# Patient Record
Sex: Female | Born: 1990 | Race: Black or African American | Hispanic: No | Marital: Single | Smoking: Never smoker
Health system: Southern US, Community
[De-identification: ages and names within clinical notes are randomized; demographics above are authoritative.]

## PROBLEM LIST (undated history)

## (undated) ENCOUNTER — Inpatient Hospital Stay (HOSPITAL_COMMUNITY): Payer: Self-pay

## (undated) DIAGNOSIS — J45909 Unspecified asthma, uncomplicated: Secondary | ICD-10-CM

## (undated) HISTORY — PX: NO PAST SURGERIES: SHX2092

---
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
## 2011-02-10 ENCOUNTER — Encounter: Payer: Self-pay | Admitting: Medical

## 2011-02-10 ENCOUNTER — Emergency Department (HOSPITAL_COMMUNITY)
Admission: EM | Admit: 2011-02-10 | Discharge: 2011-02-10 | Disposition: A | Discharge status: Home / Self Care | Payer: No Typology Code available for payment source | Attending: Emergency Medicine | Admitting: Emergency Medicine

## 2011-02-10 DIAGNOSIS — R51 Headache: Secondary | ICD-10-CM | POA: Insufficient documentation

## 2011-02-10 DIAGNOSIS — M542 Cervicalgia: Secondary | ICD-10-CM | POA: Insufficient documentation

## 2012-09-30 ENCOUNTER — Emergency Department (HOSPITAL_COMMUNITY)
Admission: EM | Admit: 2012-09-30 | Discharge: 2012-09-30 | Disposition: A | Discharge status: Home / Self Care | Payer: Self-pay | Attending: Emergency Medicine | Admitting: Emergency Medicine

## 2012-09-30 ENCOUNTER — Encounter (HOSPITAL_COMMUNITY): Payer: Self-pay | Admitting: Family Medicine

## 2012-09-30 DIAGNOSIS — N949 Unspecified condition associated with female genital organs and menstrual cycle: Secondary | ICD-10-CM | POA: Insufficient documentation

## 2012-09-30 DIAGNOSIS — R350 Frequency of micturition: Secondary | ICD-10-CM | POA: Insufficient documentation

## 2012-09-30 DIAGNOSIS — B9689 Other specified bacterial agents as the cause of diseases classified elsewhere: Secondary | ICD-10-CM

## 2012-09-30 DIAGNOSIS — R300 Dysuria: Secondary | ICD-10-CM | POA: Insufficient documentation

## 2012-09-30 DIAGNOSIS — N390 Urinary tract infection, site not specified: Secondary | ICD-10-CM

## 2012-09-30 DIAGNOSIS — Z79899 Other long term (current) drug therapy: Secondary | ICD-10-CM | POA: Insufficient documentation

## 2012-09-30 DIAGNOSIS — M129 Arthropathy, unspecified: Secondary | ICD-10-CM | POA: Insufficient documentation

## 2012-09-30 DIAGNOSIS — N760 Acute vaginitis: Secondary | ICD-10-CM | POA: Insufficient documentation

## 2012-09-30 DIAGNOSIS — F17200 Nicotine dependence, unspecified, uncomplicated: Secondary | ICD-10-CM | POA: Insufficient documentation

## 2012-09-30 DIAGNOSIS — Z3202 Encounter for pregnancy test, result negative: Secondary | ICD-10-CM | POA: Insufficient documentation

## 2012-09-30 LAB — URINALYSIS, ROUTINE W REFLEX MICROSCOPIC
Protein, ur: 30 mg/dL — AB
Specific Gravity, Urine: 1.027 (ref 1.005–1.030)
Urobilinogen, UA: 0.2 mg/dL (ref 0.0–1.0)

## 2012-09-30 LAB — URINE MICROSCOPIC-ADD ON

## 2012-09-30 LAB — POCT PREGNANCY, URINE: Preg Test, Ur: NEGATIVE

## 2012-09-30 LAB — WET PREP, GENITAL: Yeast Wet Prep HPF POC: NONE SEEN

## 2012-09-30 MED ORDER — CEPHALEXIN 500 MG PO CAPS: 500.0000 mg | ORAL_CAPSULE | Freq: Four times a day (QID) | ORAL | Status: DC

## 2012-09-30 MED ORDER — METRONIDAZOLE 500 MG PO TABS: 500.0000 mg | ORAL_TABLET | Freq: Two times a day (BID) | ORAL | Status: DC

## 2012-09-30 NOTE — ED Provider Notes (Signed)
History     CSN: [id number]  Arrival date & time 09/30/12  0041   First MD Initiated Contact with Patient 09/30/12 0116      Chief Complaint  Patient presents with  . Abdominal Pain    (Consider location/radiation/quality/duration/timing/severity/associated sxs/prior treatment) HPI Comments: Patient is a three-day history of vaginal pain that is worse with urination. Endorses urinary frequency, dysuria and burning with urination. Denies any abdominal pain, nausea, vomiting, diarrhea or fever. No vaginal bleeding or discharge. Last menstrual period February 26. Denies any back pain, chest pain or shortness of breath. No rashes.  The history is provided by the patient.    Past Medical History  Diagnosis Date  . Arthritis     History reviewed. No pertinent past surgical history.  No family history on file.  History  Substance Use Topics  . Smoking status: Current Every Day Smoker -- 0.25 packs/day    Types: Cigarettes  . Smokeless tobacco: Not on file  . Alcohol Use: Yes     Comment: Rarely    OB History   Grav Para Term Preterm Abortions TAB SAB Ect Mult Living                  Review of Systems  Constitutional: Negative for fever, activity change and appetite change.  HENT: Negative for congestion and rhinorrhea.   Respiratory: Negative for cough, chest tightness and shortness of breath.   Cardiovascular: Negative for chest pain.  Gastrointestinal: Positive for abdominal pain. Negative for nausea and vomiting.  Genitourinary: Positive for dysuria, frequency and vaginal pain. Negative for hematuria, vaginal bleeding and vaginal discharge.  Musculoskeletal: Negative for back pain.  Skin: Negative for rash.  Neurological: Negative for dizziness, weakness and headaches.  A complete 10 system review of systems was obtained and all systems are negative except as noted in the HPI and PMH.    Allergies  Review of patient's allergies indicates no known  allergies.  Home Medications   Current Outpatient Rx  Name  Route  Sig  Dispense  Refill  . CRANBERRY EXTRACT PO   Oral   Take 1 tablet by mouth daily.          Marland Kitchen ibuprofen (ADVIL,MOTRIN) 200 MG tablet   Oral   Take 200 mg by mouth every 6 (six) hours as needed for pain. For pain         . Phenazopyridine HCl (AZO TABS PO)   Oral   Take 1 tablet by mouth 3 (three) times daily.         . cephALEXin (KEFLEX) 500 MG capsule   Oral   Take 1 capsule (500 mg total) by mouth 4 (four) times daily.   40 capsule   0   . metroNIDAZOLE (FLAGYL) 500 MG tablet   Oral   Take 1 tablet (500 mg total) by mouth 2 (two) times daily.   14 tablet   0     BP 112/67  Pulse 87  Temp(Src) 98.3 F (36.8 C) (Oral)  Resp 16  SpO2 98%  LMP 09/01/2012  Physical Exam  Constitutional: She is oriented to person, place, and time. She appears well-developed and well-nourished. No distress.  HENT:  Head: Normocephalic and atraumatic.  Mouth/Throat: Oropharynx is clear and moist. No oropharyngeal exudate.  Eyes: Conjunctivae and EOM are normal.  Neck: Normal range of motion. Neck supple.  Cardiovascular: Normal rate, regular rhythm and normal heart sounds.   No murmur heard. Pulmonary/Chest: Effort normal and breath sounds  normal. No respiratory distress.  Abdominal: Soft. There is no tenderness. There is no rebound and no guarding.  Genitourinary: There is no tenderness on the right labia. There is no tenderness on the left labia. Cervix exhibits no motion tenderness. Right adnexum displays no mass and no tenderness. Left adnexum displays no mass and no tenderness. No vaginal discharge found.  Chaperone present  Musculoskeletal: Normal range of motion. She exhibits no edema and no tenderness.  Neurological: She is alert and oriented to person, place, and time.  Skin: Skin is warm.    ED Course  Procedures (including critical care time)  Labs Reviewed  WET PREP, GENITAL - Abnormal;  Notable for the following:    Clue Cells Wet Prep HPF POC RARE (*)    WBC, Wet Prep HPF POC FEW (*)    All other components within normal limits  URINALYSIS, ROUTINE W REFLEX MICROSCOPIC - Abnormal; Notable for the following:    APPearance CLOUDY (*)    Hgb urine dipstick SMALL (*)    Protein, ur 30 (*)    Nitrite POSITIVE (*)    Leukocytes, UA LARGE (*)    All other components within normal limits  URINE MICROSCOPIC-ADD ON - Abnormal; Notable for the following:    Bacteria, UA FEW (*)    All other components within normal limits  GC/CHLAMYDIA PROBE AMP  URINE CULTURE  POCT PREGNANCY, URINE   No results found.   1. Urinary tract infection   2. Bacterial vaginosis       MDM  Vaginal pain, dysuria for the past week. Abdomen soft and nontender. No associated symptoms.  Urinalysis positive for infection. Pregnancy test negative. Pelvic exam benign. We'll treat bacterial vaginosis and UTI.  Tolerating by mouth in ED. Abdomen soft and nontender. Return precautions discussed.     Glynn Octave, MD 09/30/12 [phone]

## 2012-09-30 NOTE — ED Notes (Signed)
Patient states that she has had abdominal pain for a week. Denies n/v/d or fever. Indicates suprapubic area; reports having dysuria.

## 2012-10-01 LAB — URINE CULTURE

## 2012-10-02 ENCOUNTER — Telehealth (HOSPITAL_COMMUNITY): Payer: Self-pay | Admitting: Emergency Medicine

## 2012-10-02 NOTE — ED Notes (Signed)
+  Urine. Patient treated with Keflex. Sensitive to same. Per protocol MD. °

## 2012-10-02 NOTE — ED Notes (Signed)
Patient has +Urine culture. Checking to see if appropriately treated. °

## 2013-03-11 ENCOUNTER — Emergency Department (HOSPITAL_COMMUNITY)
Admission: EM | Admit: 2013-03-11 | Discharge: 2013-03-12 | Disposition: A | Discharge status: Home / Self Care | Payer: Medicaid Other | Attending: Emergency Medicine | Admitting: Emergency Medicine

## 2013-03-11 ENCOUNTER — Encounter (HOSPITAL_COMMUNITY): Payer: Self-pay | Admitting: Emergency Medicine

## 2013-03-11 DIAGNOSIS — R350 Frequency of micturition: Secondary | ICD-10-CM | POA: Insufficient documentation

## 2013-03-11 DIAGNOSIS — R109 Unspecified abdominal pain: Secondary | ICD-10-CM | POA: Insufficient documentation

## 2013-03-11 DIAGNOSIS — O9989 Other specified diseases and conditions complicating pregnancy, childbirth and the puerperium: Secondary | ICD-10-CM | POA: Insufficient documentation

## 2013-03-11 DIAGNOSIS — R110 Nausea: Secondary | ICD-10-CM | POA: Insufficient documentation

## 2013-03-11 DIAGNOSIS — N898 Other specified noninflammatory disorders of vagina: Secondary | ICD-10-CM | POA: Insufficient documentation

## 2013-03-11 DIAGNOSIS — O99330 Smoking (tobacco) complicating pregnancy, unspecified trimester: Secondary | ICD-10-CM | POA: Insufficient documentation

## 2013-03-11 DIAGNOSIS — Z3490 Encounter for supervision of normal pregnancy, unspecified, unspecified trimester: Secondary | ICD-10-CM

## 2013-03-11 NOTE — ED Notes (Signed)
Pt c/o mid abd pain onset 1-2 weeks, +nausea, denies v/d, denies fever. Denies urinary s/s, denies gyn s/s

## 2013-03-12 ENCOUNTER — Emergency Department (HOSPITAL_COMMUNITY): Payer: Medicaid Other

## 2013-03-12 LAB — CBC
HCT: 36.6 % (ref 36.0–46.0)
Hemoglobin: 12.6 g/dL (ref 12.0–15.0)
MCH: 29 pg (ref 26.0–34.0)
MCHC: 34.4 g/dL (ref 30.0–36.0)
MCV: 84.1 fL (ref 78.0–100.0)
Platelets: 281 10*3/uL (ref 150–400)
RBC: 4.35 MIL/uL (ref 3.87–5.11)
RDW: 13.7 % (ref 11.5–15.5)
WBC: 8.6 10*3/uL (ref 4.0–10.5)

## 2013-03-12 LAB — ABO/RH: ABO/RH(D): O POS

## 2013-03-12 LAB — COMPREHENSIVE METABOLIC PANEL
ALT: 10 U/L (ref 0–35)
AST: 15 U/L (ref 0–37)
Albumin: 4 g/dL (ref 3.5–5.2)
Alkaline Phosphatase: 51 U/L (ref 39–117)
BUN: 5 mg/dL — ABNORMAL LOW (ref 6–23)
CO2: 23 mEq/L (ref 19–32)
Calcium: 9.4 mg/dL (ref 8.4–10.5)
Chloride: 98 mEq/L (ref 96–112)
Creatinine, Ser: 0.63 mg/dL (ref 0.50–1.10)
GFR calc Af Amer: >90 mL/min (ref >90)
GFR calc non Af Amer: >90 mL/min (ref >90)
Glucose, Bld: 80 mg/dL (ref 70–99)
Potassium: 3.7 mEq/L (ref 3.5–5.1)
Sodium: 132 mEq/L — ABNORMAL LOW (ref 135–145)
Total Bilirubin: 0.3 mg/dL (ref 0.3–1.2)
Total Protein: 8 g/dL (ref 6.0–8.3)

## 2013-03-12 LAB — URINALYSIS, ROUTINE W REFLEX MICROSCOPIC
Glucose, UA: NEGATIVE mg/dL
Hgb urine dipstick: NEGATIVE
Ketones, ur: >80 mg/dL — AB
Nitrite: NEGATIVE
Protein, ur: NEGATIVE mg/dL
Specific Gravity, Urine: 1.034 — ABNORMAL HIGH (ref 1.005–1.030)
Urobilinogen, UA: 2 mg/dL — ABNORMAL HIGH (ref 0.0–1.0)
pH: 6 (ref 5.0–8.0)

## 2013-03-12 LAB — URINE MICROSCOPIC-ADD ON

## 2013-03-12 LAB — TYPE AND SCREEN
ABO/RH(D): O POS
Antibody Screen: NEGATIVE

## 2013-03-12 LAB — HCG, QUANTITATIVE, PREGNANCY: hCG, Beta Chain, Quant, S: 70498 m[IU]/mL — ABNORMAL HIGH (ref <5)

## 2013-03-12 LAB — WET PREP, GENITAL
Clue Cells Wet Prep HPF POC: NONE SEEN
Trich, Wet Prep: NONE SEEN
Yeast Wet Prep HPF POC: NONE SEEN

## 2013-03-12 LAB — PREGNANCY, URINE: Preg Test, Ur: POSITIVE — AB

## 2013-03-12 MED ORDER — ONDANSETRON HCL 8 MG PO TABS: 8.0000 mg | ORAL_TABLET | Freq: Three times a day (TID) | ORAL | Status: DC | PRN

## 2013-03-12 MED ORDER — ONDANSETRON 4 MG PO TBDP
4.0000 mg | ORAL_TABLET | Freq: Once | ORAL | Status: AC
  Administered 2013-03-12: 4 mg via ORAL
  Filled 2013-03-12: qty 1
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
## 2013-03-12 NOTE — ED Provider Notes (Signed)
Medical screening examination/treatment/procedure(s) were performed by non-physician practitioner and as supervising physician I was immediately available for consultation/collaboration.   Moise Friday E Oliwia Berzins, MD 03/12/13 0648 

## 2013-03-12 NOTE — ED Provider Notes (Signed)
CSN: [id number]     Arrival date & time 03/11/13  2317 History   First MD Initiated Contact with Patient 03/11/13 2331     Chief Complaint  Patient presents with  . Abdominal Pain   (Consider location/radiation/quality/duration/timing/severity/associated sxs/prior Treatment) HPI Pt is a 22yo female with c/o constant intermittent abdominal pain that is cramping in nature, 3/10 at best, 7/10 at worst.  Pain is worse in middle right side of abdomen, worse with palpation.  Pt also c/o nausea w/o vomiting.  Pt has 2-3 episodes of diarrhea per day for 1 week w/o blood or mucous.  Pt is sexually active, not on birth control and does not use condoms when having intercourse.  States LMP was at end of July but she does not normally have regular menstrual cycles.  Denies hx of previous pregnancy, not concerned for STDs.  No hx of similar pain.  No hx of abdominal surgeries.    History reviewed. No pertinent past medical history. History reviewed. No pertinent past surgical history. No family history on file. History  Substance Use Topics  . Smoking status: Current Every Day Smoker -- 0.25 packs/day    Types: Cigarettes  . Smokeless tobacco: Not on file  . Alcohol Use: Yes     Comment: Rarely   OB History   Grav Para Term Preterm Abortions TAB SAB Ect Mult Living                 Review of Systems  Constitutional: Negative for fever and chills.  Gastrointestinal: Positive for nausea and abdominal pain. Negative for vomiting and diarrhea.  Genitourinary: Positive for frequency. Negative for dysuria, flank pain, vaginal bleeding, vaginal discharge, vaginal pain and pelvic pain.  All other systems reviewed and are negative.    Allergies  Review of patient's allergies indicates no known allergies.  Home Medications   Current Outpatient Rx  Name  Route  Sig  Dispense  Refill  . bismuth subsalicylate (PEPTO BISMOL) 262 MG chewable tablet   Oral   Chew 524 mg by mouth daily as needed for  indigestion.          BP 111/71  Pulse 101  Temp(Src) 98.9 F (37.2 C) (Oral)  Resp 20  Ht 5\' 1"  (1.549 m)  Wt 131 lb (59.421 kg)  BMI 24.76 kg/m2  SpO2 100%  LMP 02/03/2013 Physical Exam  Nursing note and vitals reviewed. Constitutional: She appears well-developed and well-nourished. No distress.  HENT:  Head: Normocephalic and atraumatic.  Eyes: Conjunctivae are normal. No scleral icterus.  Neck: Normal range of motion.  Cardiovascular: Normal rate, regular rhythm and normal heart sounds.   Pulmonary/Chest: Effort normal and breath sounds normal. No respiratory distress. She has no wheezes. She has no rales. She exhibits no tenderness.  Abdominal: Soft. Bowel sounds are normal. She exhibits no distension and no mass. There is tenderness ( right abdomen). There is no rebound and no guarding.    Genitourinary: Uterus normal. No labial fusion. There is no rash, tenderness, lesion or injury on the right labia. There is no rash, tenderness, lesion or injury on the left labia. Cervix exhibits discharge ( small amount of white discharge ). Cervix exhibits no motion tenderness and no friability. Right adnexum displays no mass, no tenderness and no fullness. Left adnexum displays no mass, no tenderness and no fullness. No erythema, tenderness or bleeding around the vagina. No foreign body around the vagina. No signs of injury around the vagina. Vaginal discharge (small amount of  clear to white discharge in vaginal canal) found.  Musculoskeletal: Normal range of motion.  Neurological: She is alert.  Skin: Skin is warm and dry. She is not diaphoretic.    ED Course  Procedures (including critical care time) Labs Review Labs Reviewed  WET PREP, GENITAL - Abnormal; Notable for the following:    WBC, Wet Prep HPF POC TOO NUMEROUS TO COUNT (*)    All other components within normal limits  URINALYSIS, ROUTINE W REFLEX MICROSCOPIC - Abnormal; Notable for the following:    Specific Gravity,  Urine 1.034 (*)    Bilirubin Urine SMALL (*)    Ketones, ur >80 (*)    Urobilinogen, UA 2.0 (*)    Leukocytes, UA SMALL (*)    All other components within normal limits  PREGNANCY, URINE - Abnormal; Notable for the following:    Preg Test, Ur POSITIVE (*)    All other components within normal limits  COMPREHENSIVE METABOLIC PANEL - Abnormal; Notable for the following:    Sodium 132 (*)    BUN 5 (*)    All other components within normal limits  HCG, QUANTITATIVE, PREGNANCY - Abnormal; Notable for the following:    hCG, Beta Chain, Quant, [place] [address] (*)    All other components within normal limits  URINE MICROSCOPIC-ADD ON - Abnormal; Notable for the following:    Squamous Epithelial / LPF FEW (*)    All other components within normal limits  GC/CHLAMYDIA PROBE AMP  CBC  TYPE AND SCREEN  ABO/RH   Imaging Review No results found.  MDM  No diagnosis found. Abdominal pain initially non-specific, mild TTP in right abdomen.  Ordered CBC and CMP with UA and urine pregnancy.  Pt found to be pregnant on urine pregnancy.  Wet prep and GC/Chlamydia sent.  Will get quantitative Hcg, type and screen as well as pelvic ultrasound.  Informed pt of positive pregnancy test.  She admitted to not using protection while having intercourse with her boyfriend but states she is not concerned for an STD.  Tx: zofran, did not need pain medications at this time.  Stated Zofran did help significantly with her nausea.   1:10 AM Signed out to [place] at shift change. Plan is to discharge pt home to [facility] normal pelvic/vaginal ultrasound.     Junius Finner, PA-C 03/12/13 0110

## 2013-03-12 NOTE — ED Provider Notes (Signed)
Medical screening examination/treatment/procedure(s) were performed by non-physician practitioner and as supervising physician I was immediately available for consultation/collaboration.   Shanna Cisco, MD 03/12/13 [phone]

## 2013-03-14 LAB — GC/CHLAMYDIA PROBE AMP
CT Probe RNA: NEGATIVE
GC Probe RNA: NEGATIVE

## 2013-04-07 LAB — OB RESULTS CONSOLE RPR: RPR: NONREACTIVE

## 2013-04-07 LAB — OB RESULTS CONSOLE HEPATITIS B SURFACE ANTIGEN: Hepatitis B Surface Ag: NEGATIVE

## 2013-04-07 LAB — OB RESULTS CONSOLE RUBELLA ANTIBODY, IGM: Rubella: IMMUNE

## 2013-04-07 LAB — OB RESULTS CONSOLE ABO/RH: RH Type: POSITIVE

## 2013-04-07 LAB — OB RESULTS CONSOLE HIV ANTIBODY (ROUTINE TESTING): HIV: NONREACTIVE

## 2013-05-04 ENCOUNTER — Encounter: Payer: Self-pay | Admitting: Family Medicine

## 2013-06-20 ENCOUNTER — Encounter (HOSPITAL_COMMUNITY): Payer: Self-pay | Admitting: Emergency Medicine

## 2013-06-20 ENCOUNTER — Inpatient Hospital Stay (HOSPITAL_COMMUNITY): Payer: No Typology Code available for payment source

## 2013-06-20 ENCOUNTER — Inpatient Hospital Stay (HOSPITAL_COMMUNITY)
Admission: EM | Admit: 2013-06-20 | Discharge: 2013-06-20 | Disposition: A | Discharge status: Home / Self Care | Payer: No Typology Code available for payment source | Attending: Obstetrics and Gynecology | Admitting: Obstetrics and Gynecology

## 2013-06-20 DIAGNOSIS — Y92410 Unspecified street and highway as the place of occurrence of the external cause: Secondary | ICD-10-CM | POA: Diagnosis not present

## 2013-06-20 DIAGNOSIS — O99891 Other specified diseases and conditions complicating pregnancy: Secondary | ICD-10-CM | POA: Insufficient documentation

## 2013-06-20 DIAGNOSIS — R1032 Left lower quadrant pain: Secondary | ICD-10-CM | POA: Diagnosis not present

## 2013-06-20 DIAGNOSIS — R109 Unspecified abdominal pain: Secondary | ICD-10-CM | POA: Diagnosis present

## 2013-06-20 HISTORY — DX: Unspecified asthma, uncomplicated: J45.909

## 2013-06-20 MED ORDER — IBUPROFEN 400 MG PO TABS
600.0000 mg | ORAL_TABLET | Freq: Once | ORAL | Status: AC
  Administered 2013-06-20: 600 mg via ORAL
  Filled 2013-06-20 (×2): qty 1

## 2013-06-20 NOTE — Progress Notes (Signed)
Spoke with Dr. Emilee Hero MD and told him that pt can be transferred to [facility] MAU as long as she is cleared medically here. States he will arrange for transport.

## 2013-06-20 NOTE — ED Notes (Addendum)
Pt c/o lower intermittent left abd pain rates pain 4/10 sharp in nature. Pt denies any bleeding.
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
## 2013-06-20 NOTE — Progress Notes (Signed)
Chaplain to respond to page from ED. Patient not available.  Chaplain Jones Skene

## 2013-06-20 NOTE — ED Notes (Signed)
Patient placed on toco monitor at this time

## 2013-06-20 NOTE — Progress Notes (Signed)
Spoke with Dr. Pennie Rushing. She says it is fine for [facility] RN to leave at this point. Still waiting for [facility].
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
## 2013-06-20 NOTE — ED Notes (Signed)
OB nurse at bedside 
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
## 2013-06-20 NOTE — Progress Notes (Signed)
Report given to Ernesta Amble RN, charge nurse MAU [facility].
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
## 2013-06-20 NOTE — MAU Note (Signed)
Patient is brought in from [facility] by [facility] for an OB ultrasound. Patient is c/o rlq sharp constant pain (patient states that there are no relief from motrin). Denies any vaginal bleeding or LOF. Abdomen is soft and non-tender. fht [phone]
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
## 2013-07-07 NOTE — L&D Delivery Note (Signed)
Delivery Note  Cervix complete about 0230 and pt began pushing, FHR w variables but overall reassuring   At 3:20 AM a viable female was delivered via Vaginal, Spontaneous Delivery (Presentation: Right Occiput Anterior).  Reduced loose nuchal cord, prior to delivery, APGAR: 8, 9; weight 6 lb 1.9 oz (2775 g).   Placenta status: Intact, Spontaneous.  Cord: 3 vessels with the following complications: None.  Cord pH: n/a Cord blood for donation collected   Anesthesia: Epidural  Episiotomy: None Lacerations: superficial R inner labial laceration hemostatic and not requiring repair  Suture Repair: n/a Est. Blood Loss (mL): 200  Mom to postpartum.  Baby to Couplet care / Skin to Skin. Pt plans to BF Desires inpatient circumcision Routine PP orders   Malissa HippoShelley M Aariyah Sampey 10/23/2013, 5:25 AM

## 2013-10-05 LAB — OB RESULTS CONSOLE GC/CHLAMYDIA
Chlamydia: NEGATIVE
GC PROBE AMP, GENITAL: NEGATIVE

## 2013-10-05 LAB — OB RESULTS CONSOLE GBS: STREP GROUP B AG: NEGATIVE

## 2013-10-19 ENCOUNTER — Inpatient Hospital Stay (HOSPITAL_COMMUNITY): Payer: Medicaid Other

## 2013-10-19 ENCOUNTER — Inpatient Hospital Stay (HOSPITAL_COMMUNITY)
Admission: AD | Admit: 2013-10-19 | Discharge: 2013-10-20 | Disposition: A | Discharge status: Home / Self Care | Payer: Medicaid Other | Source: Ambulatory Visit | Attending: Obstetrics and Gynecology | Admitting: Obstetrics and Gynecology

## 2013-10-19 ENCOUNTER — Encounter (HOSPITAL_COMMUNITY): Payer: Self-pay

## 2013-10-19 DIAGNOSIS — O368190 Decreased fetal movements, unspecified trimester, not applicable or unspecified: Secondary | ICD-10-CM | POA: Insufficient documentation

## 2013-10-19 LAB — URINALYSIS, ROUTINE W REFLEX MICROSCOPIC
Bilirubin Urine: NEGATIVE
GLUCOSE, UA: NEGATIVE mg/dL
HGB URINE DIPSTICK: NEGATIVE
KETONES UR: NEGATIVE mg/dL
Nitrite: NEGATIVE
PROTEIN: NEGATIVE mg/dL
Specific Gravity, Urine: <1.005 — ABNORMAL LOW (ref 1.005–1.030)
UROBILINOGEN UA: 0.2 mg/dL (ref 0.0–1.0)
pH: 7 (ref 5.0–8.0)

## 2013-10-19 LAB — URINE MICROSCOPIC-ADD ON

## 2013-10-19 NOTE — MAU Note (Signed)
Pt presents complaining of decreased fetal movement since 1530 this afternoon and some mucousy discharge. Denies vaginal bleeding.

## 2013-10-19 NOTE — MAU Provider Note (Signed)
History   22yo, G1P0 at 4030w2d presents with c/o decreased FM since 1530 this afternoon and some mucousy discharge. Denies VB, UCs, LOF, recent fever, resp or GI c/o's, UTI or PIH s/s.     There are no active problems to display for this patient.   Chief Complaint  Patient presents with  . Decreased Fetal Movement   HPI  OB History   Grav Para Term Preterm Abortions TAB SAB Ect Mult Living   1               Past Medical History  Diagnosis Date  . Asthma     History reviewed. No pertinent past surgical history.  History reviewed. No pertinent family history.  History  Substance Use Topics  . Smoking status: Never Smoker   . Smokeless tobacco: Not on file  . Alcohol Use: Yes     Comment: Rarely    Allergies: No Known Allergies  Prescriptions prior to admission  Medication Sig Dispense Refill  . acetaminophen (TYLENOL) 500 MG tablet Take 1,000 mg by mouth every 6 (six) hours as needed for moderate pain.      . Prenatal Vit-Fe Fumarate-FA (PRENATAL MULTIVITAMIN) TABS tablet Take 1 tablet by mouth daily at 12 noon.        ROS ROS: see HPI above, all other systems are negative  Physical Exam   Blood pressure 131/89, pulse 91, temperature 98 F (36.7 C), temperature source Oral, resp. rate 18, last menstrual period 02/03/2013.  Physical Exam Chest: Clear Heart: RRR Abdomen: gravid, NT Extremities: WNL  FHT: Reactive NST UCs: Occastional  ED Course  IUP at 2230w2d Decreased FM  Reactive NST BPP 8/8  Discussed [facility] method D/c home with precautions Next ROB scheduled 4/16    Haroldine LawsJennifer Lori Popowski CNM, MSN 10/19/2013 9:37 PM

## 2013-10-20 NOTE — Discharge Instructions (Signed)
Fetal Movement Counts Patient Name: __________________________________________________ Patient Due Date: ____________________ Performing a fetal movement count is highly recommended in high-risk pregnancies, but it is good for every pregnant woman to do. Your caregiver may ask you to start counting fetal movements at 28 weeks of the pregnancy. Fetal movements often increase:  After eating a full meal.  After physical activity.  After eating or drinking something sweet or cold.  At rest. Pay attention to when you feel the baby is most active. This will help you notice a pattern of your baby's sleep and wake cycles and what factors contribute to an increase in fetal movement. It is important to perform a fetal movement count at the same time each day when your baby is normally most active.  HOW TO COUNT FETAL MOVEMENTS 1. Find a quiet and comfortable area to sit or lie down on your left side. Lying on your left side provides the best blood and oxygen circulation to your baby. 2. Write down the day and time on a sheet of paper or in a journal. 3. Start counting kicks, flutters, swishes, rolls, or jabs in a 2 hour period. You should feel at least 10 movements within 2 hours. 4. If you do not feel 10 movements in 2 hours, wait 2 3 hours and count again. Look for a change in the pattern or not enough counts in 2 hours. SEEK MEDICAL CARE IF:  You feel less than 10 counts in 2 hours, tried twice.  There is no movement in over an hour.  The pattern is changing or taking longer each day to reach 10 counts in 2 hours.  You feel the baby is not moving as he or she usually does. Date: ____________ Movements: ____________ Start time: ____________ Deanna MartinFinish time: ____________  Date: ____________ Movements: ____________ Start time: ____________ Deanna MartinFinish time: ____________ Date: ____________ Movements: ____________ Start time: ____________ Deanna MartinFinish time: ____________ Date: ____________ Movements: ____________  Start time: ____________ Deanna MartinFinish time: ____________ Document Released: 07/23/2006 Document Revised: 06/09/2012 Document Reviewed: 04/19/2012 ExitCare Patient Information 2014 [place], [place]

## 2013-10-21 ENCOUNTER — Encounter (HOSPITAL_COMMUNITY): Payer: Self-pay | Admitting: *Deleted

## 2013-10-21 ENCOUNTER — Inpatient Hospital Stay (HOSPITAL_COMMUNITY)
Admission: AD | Admit: 2013-10-21 | Discharge: 2013-10-21 | Disposition: A | Discharge status: Home / Self Care | Payer: Medicaid Other | Source: Ambulatory Visit | Attending: Obstetrics and Gynecology | Admitting: Obstetrics and Gynecology

## 2013-10-21 DIAGNOSIS — O4690 Antepartum hemorrhage, unspecified, unspecified trimester: Secondary | ICD-10-CM | POA: Insufficient documentation

## 2013-10-21 DIAGNOSIS — O479 False labor, unspecified: Secondary | ICD-10-CM

## 2013-10-21 NOTE — MAU Note (Signed)
Patient states she had red bleeding on tissue with wiping when wiping about 30 minutes ago. Last intercourse last night. Reports good fetal movement.

## 2013-10-21 NOTE — Discharge Instructions (Signed)
Vaginal Bleeding During Pregnancy A small amount of bleeding from the vagina can happen anytime during pregnancy. It usually stops on its own. However, some bleeding can be serious. Be sure to tell your doctor about all vaginal bleeding. HOME CARE  Get plenty of rest and sleep.  Stay in bed and only get up to go to the bathroom as told by your doctor.  Write down the number of pads you use each day. Note how soaked they are.  Do not use tampons. Do not clean the vagina with a stream of water (douche).  Do not have sex (intercourse) or put anything into your vagina. Have this approved by your doctor.  Save any tissue that comes from your vagina. Show it to your doctor.  Only take medicine as told by your doctor.  Follow your doctor's advice about lifting, driving, and physical activity. GET HELP RIGHT AWAY IF:   You feel your baby move less or not at all.  You pass out (faint) while going to the bathroom.  You have more bleeding.  You start to have contractions.  You have severe cramps in your stomach, back, or belly (abdomen).  You are leaking fluid or have a gush of fluid from your vagina.  You become lightheaded or weak.  You have chills.  You have clumps of tissue or blood clots coming from your vagina.  You have a fever. MAKE SURE YOU:   Understand these instructions.  Will watch your condition.  Will get help right away if you are not doing well or get worse. Document Released: 04/01/2008 Document Revised: 06/09/2012 Document Reviewed: 04/12/2012 [facility] Patient Information 2014 [place], [place]. Fetal Movement Counts Patient Name: __________________________________________________ Patient Due Date: ____________________ Performing a fetal movement count is highly recommended in high-risk pregnancies, but it is good for every pregnant woman to do. Your caregiver may ask you to start counting fetal movements at 28 weeks of the pregnancy. Fetal movements often  increase:  After eating a full meal.  After physical activity.  After eating or drinking something sweet or cold.  At rest. Pay attention to when you feel the baby is most active. This will help you notice a pattern of your baby's sleep and wake cycles and what factors contribute to an increase in fetal movement. It is important to perform a fetal movement count at the same time each day when your baby is normally most active.  HOW TO COUNT FETAL MOVEMENTS 1. Find a quiet and comfortable area to sit or lie down on your left side. Lying on your left side provides the best blood and oxygen circulation to your baby. 2. Write down the day and time on a sheet of paper or in a journal. 3. Start counting kicks, flutters, swishes, rolls, or jabs in a 2 hour period. You should feel at least 10 movements within 2 hours. 4. If you do not feel 10 movements in 2 hours, wait 2 3 hours and count again. Look for a change in the pattern or not enough counts in 2 hours. SEEK MEDICAL CARE IF:  You feel less than 10 counts in 2 hours, tried twice.  There is no movement in over an hour.  The pattern is changing or taking longer each day to reach 10 counts in 2 hours.  You feel the baby is not moving as he or she usually does. Date: ____________ Movements: ____________ Start time: ____________ Deanna MartinFinish time: ____________  Date: ____________ Movements: ____________ Start time: ____________ Deanna MartinFinish  time: ____________ Date: ____________ Movements: ____________ Start time: ____________ Finish time: ____________ Date: ____________ Movements: ____________ Start time: ____________ Deanna MartinFinish time: ____________ Date: ____________ Movements: ____________ Start time: ____________ Deanna MartinFinish time: ____________ Date: ____________ Movements: ____________ Start time: ____________ Deanna MartinFinish time: ____________ Date: ____________ Movements: ____________ Start time: ____________ Deanna MartinFinish time: ____________ Date: ____________ Movements:  ____________ Start time: ____________ Deanna MartinFinish time: ____________  Date: ____________ Movements: ____________ Start time: ____________ Deanna MartinFinish time: ____________ Date: ____________ Movements: ____________ Start time: ____________ Deanna MartinFinish time: ____________ Date: _____[name]_______ Movements: ____________ Start time: ____________ Deanna MartinFinish time: ____________ Date: ____________ Movements: ____________ Start time: ____________ Deanna MartinFinish time: ____________ Date: ____________ Movements: ____________ Start time: ____________ Deanna MartinFinish time: ____________ Date: ____________ Movements: ____________ Start time: ____________ Deanna MartinFinish time: ____________ Date: ____________ Movements: ____________ Start time: ____________ Deanna MartinFinish time: ____________  Date: ____________ Movements: ____________ Start time: ____________ Deanna MartinFinish time: ____________ Date: ____________ Movements: ____________ Start time: ____________ Deanna MartinFinish time: ____________ Date: ____________ Movements: ____________ Start time: ____________ Deanna MartinFinish time: ____________ Date: ____________ Movements: ____________ Start time: ____________ Deanna MartinFinish time: ____________ Date: ____________ Movements: ____________ Start time: ____________ Deanna MartinFinish time: ____________ Date: ____________ Movements: ____________ Start time: ____________ Deanna MartinFinish time: ____________ Date: ____________ Movements: ____________ Start time: ____________ Deanna MartinFinish time: ____________  Date: ____________ Movements: ____________ Start time: ____________ Deanna MartinFinish time: ____________ Date: ____________ Movements: ____________ Start time: ____________ Deanna MartinFinish time: ____________ Date: ____________ Movements: ____________ Start time: ____________ Deanna MartinFinish time: ____________ Date: ____________ Movements: ____________ Start time: ____________ Deanna MartinFinish time: ____________ Date: ____________ Movements: ____________ Start time: ____________ Deanna MartinFinish time: ____________ Date: ____________ Movements: ____________ Start time: ____________ Deanna MartinFinish  time: ____________ Date: ____________ Movements: ____________ Start time: ____________ Deanna MartinFinish time: ____________  Date: ____________ Movements: ____________ Start time: ____________ Deanna MartinFinish time: ____________ Date: ____________ Movements: ____________ Start time: ____________ Deanna MartinFinish time: ____________ Date: ____________ Movements: ____________ Start time: ____________ Deanna MartinFinish time: ____________ Date: ____________ Movements: ____________ Start time: ____________ Deanna MartinFinish time: ____________ Date: ____________ Movements: ____________ Start time: ____________ Deanna MartinFinish time: ____________ Date: ____________ Movements: ____________ Start time: ____________ Deanna MartinFinish time: ____________ Date: ____________ Movements: ____________ Start time: ____________ Deanna MartinFinish time: ____________  Date: ____________ Movements: ____________ Start time: ____________ Deanna MartinFinish time: ____________ Date: ____________ Movements: ____________ Start time: ____________ Deanna MartinFinish time: ____________ Date: ____________ Movements: ____________ Start time: ____________ Deanna MartinFinish time: ____________ Date: ____________ Movements: ____________ Start time: ____________ Deanna MartinFinish time: ____________ Date: ____________ Movements: ____________ Start time: ____________ Deanna MartinFinish time: ____________ Date: ____________ Movements: ____________ Start time: ____________ Deanna MartinFinish time: ____________ Date: ____________ Movements: ____________ Start time: ____________ Deanna MartinFinish time: ____________  Date: ____________ Movements: ____________ Start time: ____________ Deanna MartinFinish time: ____________ Date: ____________ Movements: ____________ Start time: ____________ Deanna MartinFinish time: ____________ Date: ____________ Movements: ____________ Start time: ____________ Deanna MartinFinish time: ____________ Date: ____________ Movements: ____________ Start time: ____________ Deanna MartinFinish time: ____________ Date: ____________ Movements: ____________ Start time: ____________ Deanna MartinFinish time: ____________ Date: ____________  Movements: ____________ Start time: ____________ Deanna MartinFinish time: ____________ Date: ____________ Movements: ____________ Start time: ____________ Deanna MartinFinish time: ____________  Date: ____________ Movements: ____________ Start time: ____________ Deanna MartinFinish time: ____________ Date: ____________ Movements: ____________ Start time: ____________ Deanna MartinFinish time: ____________ Date: ____________ Movements: ____________ Start time: ____________ Deanna MartinFinish time: ____________ Date: ____________ Movements: ____________ Start time: ____________ Deanna MartinFinish time: ____________ Date: ____________ Movements: ____________ Start time: ____________ Deanna MartinFinish time: ____________ Date: ____________ Movements: ____________ Start time: ____________ Deanna MartinFinish time: ____________ Document Released: 07/23/2006 Document Revised: 06/09/2012 Document Reviewed: 04/19/2012 ExitCare Patient Information 2014 [place], [place].

## 2013-10-21 NOTE — MAU Provider Note (Signed)
History   22y.o. G1P0 at 38.4wks presents unannounced complaining of bleeding s/p sexual intercourse. Patient states bleeding only occurs with wiping and she did not feel need to wear a pad or pantyliner. Patient declines STD testing and denies LOF, vaginal discharge.  Reports some intermittent contractions and positive fetal movement.   There are no active problems to display for this patient.   Chief Complaint  Patient presents with  . Vaginal Bleeding   HPI  OB History   Grav Para Term Preterm Abortions TAB SAB Ect Mult Living   1               Past Medical History  Diagnosis Date  . Asthma     History reviewed. No pertinent past surgical history.  History reviewed. No pertinent family history.  History  Substance Use Topics  . Smoking status: Never Smoker   . Smokeless tobacco: Not on file  . Alcohol Use: Yes     Comment: Rarely    Allergies: No Known Allergies  Prescriptions prior to admission  Medication Sig Dispense Refill  . acetaminophen (TYLENOL) 500 MG tablet Take 1,000 mg by mouth every 6 (six) hours as needed for moderate pain.      . Prenatal Vit-Fe Fumarate-FA (PRENATAL MULTIVITAMIN) TABS tablet Take 1 tablet by mouth daily at 12 noon.        ROS See HPI Above Physical Exam   Blood pressure 126/81, pulse 86, temperature 98.6 F (37 C), temperature source Oral, resp. rate 16, height 5' 2.5" (1.588 m), weight 158 lb 9.6 oz (71.94 kg), last menstrual period 02/03/2013, SpO2 100.00%.   Physical Exam Abdomen: Soft, NT at fundus SE: Light pink discharge at introitus Cervix: visualized, edematous with small bleeding at 6oclock-pressure applied with swab, mucous coming from os.  Exam: 1/50/-1 FHR:  140s bpm, Mod Var, -Decels, +Accels Toco: Irregular Contractions-Mild to palpation ED Course  IUP at 38.4wks Early Labor Vaginal Bleeding  NST-Reactive Bleeding precautions reviewed Labor precautions reviewed Discharge to home Keep appt as  scheduled-10/24/2013 Call if you have any questions or concerns prior to your next visit.   Gerrit HeckJessica Kervens Roper CNM, MSN 10/21/2013 4:59 PM

## 2013-10-21 NOTE — MAU Note (Signed)
Urine in lab 

## 2013-10-22 ENCOUNTER — Inpatient Hospital Stay (HOSPITAL_COMMUNITY): Payer: Medicaid Other | Admitting: Anesthesiology

## 2013-10-22 ENCOUNTER — Encounter (HOSPITAL_COMMUNITY): Payer: Medicaid Other | Admitting: Anesthesiology

## 2013-10-22 ENCOUNTER — Inpatient Hospital Stay (HOSPITAL_COMMUNITY)
Admission: AD | Admit: 2013-10-22 | Discharge: 2013-10-25 | DRG: 775 | Disposition: A | Discharge status: Home / Self Care | Payer: Medicaid Other | Source: Ambulatory Visit | Attending: Obstetrics and Gynecology | Admitting: Obstetrics and Gynecology

## 2013-10-22 ENCOUNTER — Encounter (HOSPITAL_COMMUNITY): Payer: Self-pay

## 2013-10-22 DIAGNOSIS — J45909 Unspecified asthma, uncomplicated: Secondary | ICD-10-CM | POA: Diagnosis not present

## 2013-10-22 DIAGNOSIS — Q699 Polydactyly, unspecified: Secondary | ICD-10-CM | POA: Diagnosis not present

## 2013-10-22 DIAGNOSIS — D649 Anemia, unspecified: Secondary | ICD-10-CM | POA: Diagnosis present

## 2013-10-22 DIAGNOSIS — O9902 Anemia complicating childbirth: Secondary | ICD-10-CM | POA: Diagnosis present

## 2013-10-22 LAB — CBC
HCT: 33.3 % — ABNORMAL LOW (ref 36.0–46.0)
Hemoglobin: 11.3 g/dL — ABNORMAL LOW (ref 12.0–15.0)
MCH: 29 pg (ref 26.0–34.0)
MCHC: 33.9 g/dL (ref 30.0–36.0)
MCV: 85.6 fL (ref 78.0–100.0)
PLATELETS: 190 10*3/uL (ref 150–400)
RBC: 3.89 MIL/uL (ref 3.87–5.11)
RDW: 14 % (ref 11.5–15.5)
WBC: 11.8 10*3/uL — ABNORMAL HIGH (ref 4.0–10.5)

## 2013-10-22 MED ORDER — LIDOCAINE HCL (PF) 1 % IJ SOLN
30.0000 mL | INTRAMUSCULAR | Status: DC | PRN
  Filled 2013-10-22: qty 30

## 2013-10-22 MED ORDER — OXYCODONE-ACETAMINOPHEN 5-325 MG PO TABS: 1.0000 | ORAL_TABLET | ORAL | Status: DC | PRN

## 2013-10-22 MED ORDER — CITRIC ACID-SODIUM CITRATE 334-500 MG/5ML PO SOLN: 30.0000 mL | ORAL | Status: DC | PRN

## 2013-10-22 MED ORDER — LACTATED RINGERS IV SOLN: 500.0000 mL | Freq: Once | INTRAVENOUS | Status: DC

## 2013-10-22 MED ORDER — ACETAMINOPHEN 325 MG PO TABS: 650.0000 mg | ORAL_TABLET | ORAL | Status: DC | PRN

## 2013-10-22 MED ORDER — FLEET ENEMA 7-19 GM/118ML RE ENEM: 1.0000 | ENEMA | RECTAL | Status: DC | PRN

## 2013-10-22 MED ORDER — ONDANSETRON HCL 4 MG/2ML IJ SOLN: 4.0000 mg | Freq: Four times a day (QID) | INTRAMUSCULAR | Status: DC | PRN

## 2013-10-22 MED ORDER — IBUPROFEN 600 MG PO TABS
600.0000 mg | ORAL_TABLET | Freq: Four times a day (QID) | ORAL | Status: DC | PRN
  Administered 2013-10-23: 600 mg via ORAL
  Filled 2013-10-22: qty 1

## 2013-10-22 MED ORDER — LACTATED RINGERS IV SOLN
INTRAVENOUS | Status: DC
  Administered 2013-10-22 – 2013-10-23 (×3): via INTRAVENOUS

## 2013-10-22 MED ORDER — FENTANYL 2.5 MCG/ML BUPIVACAINE 1/10 % EPIDURAL INFUSION (WH - ANES)
14.0000 mL/h | INTRAMUSCULAR | Status: DC | PRN
  Filled 2013-10-22: qty 125

## 2013-10-22 MED ORDER — LACTATED RINGERS IV SOLN: 500.0000 mL | INTRAVENOUS | Status: DC | PRN

## 2013-10-22 MED ORDER — OXYTOCIN 40 UNITS IN LACTATED RINGERS INFUSION - SIMPLE MED
1.0000 m[IU]/min | INTRAVENOUS | Status: DC
  Administered 2013-10-22: 1 m[IU]/min via INTRAVENOUS

## 2013-10-22 MED ORDER — EPHEDRINE 5 MG/ML INJ
10.0000 mg | INTRAVENOUS | Status: DC | PRN
  Filled 2013-10-22: qty 4
  Filled 2013-10-22: qty 2

## 2013-10-22 MED ORDER — PHENYLEPHRINE 40 MCG/ML (10ML) SYRINGE FOR IV PUSH (FOR BLOOD PRESSURE SUPPORT)
80.0000 ug | PREFILLED_SYRINGE | INTRAVENOUS | Status: DC | PRN
  Filled 2013-10-22: qty 2

## 2013-10-22 MED ORDER — PHENYLEPHRINE 40 MCG/ML (10ML) SYRINGE FOR IV PUSH (FOR BLOOD PRESSURE SUPPORT)
80.0000 ug | PREFILLED_SYRINGE | INTRAVENOUS | Status: DC | PRN
  Filled 2013-10-22: qty 2
  Filled 2013-10-22: qty 10

## 2013-10-22 MED ORDER — BUTORPHANOL TARTRATE 1 MG/ML IJ SOLN
1.0000 mg | INTRAMUSCULAR | Status: DC | PRN
  Administered 2013-10-22 (×3): 1 mg via INTRAVENOUS
  Filled 2013-10-22 (×2): qty 1

## 2013-10-22 MED ORDER — DIPHENHYDRAMINE HCL 50 MG/ML IJ SOLN: 12.5000 mg | INTRAMUSCULAR | Status: DC | PRN

## 2013-10-22 MED ORDER — EPHEDRINE 5 MG/ML INJ
10.0000 mg | INTRAVENOUS | Status: DC | PRN
  Filled 2013-10-22: qty 2

## 2013-10-22 MED ORDER — OXYTOCIN BOLUS FROM INFUSION
500.0000 mL | INTRAVENOUS | Status: DC
  Administered 2013-10-23: 500 mL via INTRAVENOUS

## 2013-10-22 MED ORDER — OXYTOCIN 40 UNITS IN LACTATED RINGERS INFUSION - SIMPLE MED
62.5000 mL/h | INTRAVENOUS | Status: DC
  Filled 2013-10-22: qty 1000

## 2013-10-22 NOTE — Progress Notes (Signed)
  Subjective: Breathing with contractions--but some irregularity of contraction strength and pattern.  Partner, Antonio, very supportive.  Objective: BP 131/86  Pulse 85  Temp(Src) 98 F (36.7 C) (Oral)  Resp 18  Ht 5' 2.5" (1.588 m)  Wt 160 lb 2 oz (72.632 kg)  BMI 28.80 kg/m2  LMP 02/03/2013      Results for orders placed during the hospital encounter of 10/22/13 (from the past 24 hour(s))  CBC     Status: Abnormal   Collection Time    10/22/13  3:25 PM      Result Value Ref Range   WBC 11.8 (*) 4.0 - 10.5 K/uL   RBC 3.89  3.87 - 5.11 MIL/uL   Hemoglobin 11.3 (*) 12.0 - 15.0 g/dL   HCT 16.133.3 (*) 09.636.0 - 04.546.0 %   MCV 85.6  78.0 - 100.0 fL   MCH 29.0  26.0 - 34.0 pg   MCHC 33.9  30.0 - 36.0 g/dL   RDW 40.914.0  81.111.5 - 91.415.5 %   Platelets 190  150 - 400 K/uL     FHT: Category 1 on intermittent monitoring UC:   irregular, every 4-6 min SVE:   4-5 cm, 80%, vtx, -1 BBOW--AROM, clear fluid  Assessment:  Early labor GBS negative  Plan: Observe status of labor s/p AROM. May continue intermittent monitoring Augment prn.  Nigel BridgemanVicki Lio Wehrly 10/22/2013, 5:27 PM

## 2013-10-22 NOTE — MAU Note (Signed)
Assumed care of patient.
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
## 2013-10-22 NOTE — Progress Notes (Signed)
Patient ID: Deanna Castillo, female   DOB: 07-09-1990, 23 y.o.   MRN: [id number] Deanna Castillo is a 23 y.o. G1P0 at 4037w5d admitted for labor   Subjective: Coping well, got relief w stadol, dozing between ctx  Objective: BP 133/83  Pulse 83  Temp(Src) 98.5 F (36.9 C) (Oral)  Resp 20  Ht 5' 2.5" (1.588 m)  Wt 160 lb 2 oz (72.632 kg)  BMI 28.80 kg/m2  LMP 02/03/2013 Filed Vitals:   10/22/13 1720 10/22/13 1830 10/22/13 1956 10/22/13 2030  BP: 146/92 138/80 137/95 133/83  Pulse: 88 80 89 83  Temp:  98 F (36.7 C) 98.5 F (36.9 C)   TempSrc:  Oral Oral   Resp: 18 16 18 20   Height:      Weight:           FHT:  Cat 1 UC:   toco irreg 2-8   SVE:   Dilation: 4.5 Effacement (%): 80 Station: -1 Exam by:: Derrel Moore CNM    Assessment / Plan:  Labor: no change after AROM,  Preeclampsia:  borderline BP's, otherwise no s/s Fetal Wellbeing:  Category I Pain Control:  Epidural Anticipated MOD:  NSVD  GBS neg Ctx pattern inadequate Discussed options of continued exp mgmnt, or pitocin augmentation, pt agrees to augmentation  Dr Su Hiltoberts updated    Howell PringleShelley M Aaryav Hopfensperger 10/22/2013, 9:24 PM

## 2013-10-22 NOTE — Anesthesia Preprocedure Evaluation (Signed)
Anesthesia Evaluation  Patient identified by MRN, date of birth, ID band Patient awake    Reviewed: Allergy & Precautions, H&P , NPO status , Patient's Chart, lab work & pertinent test results  Airway       Dental   Pulmonary neg pulmonary ROS, asthma ,          Cardiovascular negative cardio ROS      Neuro/Psych negative neurological ROS  negative psych ROS   GI/Hepatic negative GI ROS, Neg liver ROS,   Endo/Other  negative endocrine ROS  Renal/GU negative Renal ROS     Musculoskeletal negative musculoskeletal ROS (+)   Abdominal   Peds  Hematology  (+) anemia ,   Anesthesia Other Findings   Reproductive/Obstetrics negative OB ROS                           Anesthesia Physical Anesthesia Plan  ASA: II  Anesthesia Plan: Epidural   Post-op Pain Management:    Induction:   Airway Management Planned:   Additional Equipment:   Intra-op Plan:   Post-operative Plan:   Informed Consent: I have reviewed the patients History and Physical, chart, labs and discussed the procedure including the risks, benefits and alternatives for the proposed anesthesia with the patient or authorized representative who has indicated his/her understanding and acceptance.     Plan Discussed with:   Anesthesia Plan Comments:         Anesthesia Quick Evaluation

## 2013-10-23 ENCOUNTER — Encounter (HOSPITAL_COMMUNITY): Payer: Self-pay

## 2013-10-23 LAB — RPR

## 2013-10-23 MED ORDER — PRENATAL MULTIVITAMIN CH
1.0000 | ORAL_TABLET | Freq: Every day | ORAL | Status: DC
  Administered 2013-10-23 – 2013-10-25 (×3): 1 via ORAL
  Filled 2013-10-23 (×3): qty 1

## 2013-10-23 MED ORDER — LANOLIN HYDROUS EX OINT: TOPICAL_OINTMENT | CUTANEOUS | Status: DC | PRN

## 2013-10-23 MED ORDER — FENTANYL 2.5 MCG/ML BUPIVACAINE 1/10 % EPIDURAL INFUSION (WH - ANES)
INTRAMUSCULAR | Status: DC | PRN
Start: 2013-10-22 — End: 2013-10-23
  Administered 2013-10-22: 14 mL/h via EPIDURAL

## 2013-10-23 MED ORDER — MEASLES, MUMPS & RUBELLA VAC ~~LOC~~ INJ
0.5000 mL | INJECTION | Freq: Once | SUBCUTANEOUS | Status: DC
  Filled 2013-10-23: qty 0.5

## 2013-10-23 MED ORDER — DIPHENHYDRAMINE HCL 25 MG PO CAPS: 25.0000 mg | ORAL_CAPSULE | Freq: Four times a day (QID) | ORAL | Status: DC | PRN

## 2013-10-23 MED ORDER — IBUPROFEN 600 MG PO TABS
600.0000 mg | ORAL_TABLET | Freq: Four times a day (QID) | ORAL | Status: DC
  Administered 2013-10-23 – 2013-10-25 (×9): 600 mg via ORAL
  Filled 2013-10-23 (×9): qty 1

## 2013-10-23 MED ORDER — ZOLPIDEM TARTRATE 5 MG PO TABS: 5.0000 mg | ORAL_TABLET | Freq: Every evening | ORAL | Status: DC | PRN

## 2013-10-23 MED ORDER — SENNOSIDES-DOCUSATE SODIUM 8.6-50 MG PO TABS
2.0000 | ORAL_TABLET | ORAL | Status: DC
  Administered 2013-10-23 – 2013-10-24 (×2): 2 via ORAL
  Filled 2013-10-23 (×2): qty 2

## 2013-10-23 MED ORDER — TETANUS-DIPHTH-ACELL PERTUSSIS 5-2.5-18.5 LF-MCG/0.5 IM SUSP
0.5000 mL | Freq: Once | INTRAMUSCULAR | Status: AC
  Administered 2013-10-24: 0.5 mL via INTRAMUSCULAR
  Filled 2013-10-23: qty 0.5

## 2013-10-23 MED ORDER — OXYCODONE-ACETAMINOPHEN 5-325 MG PO TABS
1.0000 | ORAL_TABLET | ORAL | Status: DC | PRN
Start: 2013-10-23 — End: 2013-10-25

## 2013-10-23 MED ORDER — DIBUCAINE 1 % RE OINT: 1.0000 "application " | TOPICAL_OINTMENT | RECTAL | Status: DC | PRN

## 2013-10-23 MED ORDER — BENZOCAINE-MENTHOL 20-0.5 % EX AERO: 1.0000 "application " | INHALATION_SPRAY | CUTANEOUS | Status: DC | PRN

## 2013-10-23 MED ORDER — ONDANSETRON HCL 4 MG PO TABS: 4.0000 mg | ORAL_TABLET | ORAL | Status: DC | PRN

## 2013-10-23 MED ORDER — ONDANSETRON HCL 4 MG/2ML IJ SOLN: 4.0000 mg | INTRAMUSCULAR | Status: DC | PRN

## 2013-10-23 MED ORDER — BISACODYL 10 MG RE SUPP: 10.0000 mg | Freq: Every day | RECTAL | Status: DC | PRN

## 2013-10-23 MED ORDER — SIMETHICONE 80 MG PO CHEW: 80.0000 mg | CHEWABLE_TABLET | ORAL | Status: DC | PRN

## 2013-10-23 MED ORDER — WITCH HAZEL-GLYCERIN EX PADS: 1.0000 "application " | MEDICATED_PAD | CUTANEOUS | Status: DC | PRN

## 2013-10-23 MED ORDER — FLEET ENEMA 7-19 GM/118ML RE ENEM: 1.0000 | ENEMA | Freq: Every day | RECTAL | Status: DC | PRN

## 2013-10-23 MED ORDER — LIDOCAINE HCL (PF) 1 % IJ SOLN
INTRAMUSCULAR | Status: DC | PRN
  Administered 2013-10-22 (×2): 4 mL

## 2013-10-23 MED ORDER — MEDROXYPROGESTERONE ACETATE 150 MG/ML IM SUSP: 150.0000 mg | INTRAMUSCULAR | Status: DC | PRN

## 2013-10-23 NOTE — Progress Notes (Signed)
Patient ID: Deanna Castillo, female   DOB: 06-27-1991, 23 y.o.   MRN: [id number] Deanna Castillo is a 23 y.o. G1P1001 at 4771w6d admitted for labor  Subjective: Comfortable w epidural   Objective: BP 128/73  Pulse 90  Temp(Src) 98.1 F (36.7 C) (Oral)  Resp 18  Ht 5' 2.5" (1.588 m)  Wt 160 lb 2 oz (72.632 kg)  BMI 28.80 kg/m2  SpO2 98%  LMP 02/03/2013  Breastfeeding? Unknown  Total I/O In: -  Out: 1200 [Urine:1000; Blood:200]  FHT:  Cat 1 UC:   toco 2-3   SVE:  8/100/0    Assessment / Plan:  Labor: Progressing normally Preeclampsia:  BP stable, no s/s Fetal Wellbeing:  Category I Pain Control:  Epidural Anticipated MOD:  NSVD  Progressing well, continue pitocin    Update physician PRN   Howell PringleShelley M Jaskarn Schweer 10/23/2013, 5:23 AM

## 2013-10-23 NOTE — Anesthesia Procedure Notes (Signed)
Epidural Patient location during procedure: OB Start time: 10/22/2013 11:00 PM End time: 10/22/2013 11:10 PM  Staffing Anesthesiologist: Lewie LoronGERMEROTH, Brinson Tozzi R Performed by: anesthesiologist   Preanesthetic Checklist Completed: patient identified, pre-op evaluation, timeout performed, IV checked, risks and benefits discussed and monitors and equipment checked  Epidural Patient position: sitting Prep: site prepped and draped and DuraPrep Patient monitoring: heart rate Approach: midline Location: L2-L3 Injection technique: LOR air and LOR saline  Needle:  Needle type: Tuohy  Needle gauge: 17 G Needle length: 9 cm Needle insertion depth: 6 cm Catheter type: closed end flexible Catheter size: 19 Gauge Catheter at skin depth: 12 cm Test dose: negative  Assessment Sensory level: T8 Events: blood not aspirated, injection not painful, no injection resistance, negative IV test and no paresthesia  Additional Notes Reason for block:procedure for pain

## 2013-10-23 NOTE — Anesthesia Postprocedure Evaluation (Signed)
Anesthesia Post Note  Patient: Deanna Castillo  Procedure(s) Performed: * No procedures listed *  Anesthesia type: Epidural  Patient location: Mother/Baby  Post pain: Pain level controlled  Post assessment: Post-op Vital signs reviewed  Last Vitals:  Filed Vitals:   10/23/13 1124  BP: 123/79  Pulse: 79  Temp: 37.2 C  Resp:     Post vital signs: Reviewed  Level of consciousness:alert  Complications: No apparent anesthesia complications

## 2013-10-23 NOTE — Progress Notes (Signed)
Subjective: Postpartum Day 0: Vaginal delivery, no laceration Patient up ad lib, reports no syncope or dizziness. Feeding:  Breast Contraceptive plan:  Undecided  Objective: Vital signs in last 24 hours: Temp:  [97.9 F (36.6 C)-99.2 F (37.3 C)] 99.2 F (37.3 C) (04/19 0746) Pulse Rate:  [80-179] 86 (04/19 0746) Resp:  [16-20] 20 (04/19 0746) BP: (90-159)/(34-129) 114/68 mmHg (04/19 0746) SpO2:  [98 %-100 %] 100 % (04/19 0746) Weight:  [160 lb 2 oz (72.632 kg)] 160 lb 2 oz (72.632 kg) (04/18 1204)  Physical Exam:  General: alert Lochia: appropriate Uterine Fundus: firm Perineum: Intact DVT Evaluation: No evidence of DVT seen on physical exam. Negative Homan's sign.    Recent Labs  10/22/13 1525  HGB 11.3*  HCT 33.3*    Assessment/Plan: Status post vaginal delivery day 0 Stable Continue current care. Planning outpatient circ, after discussion of hospital vs. office cost.    Deanna BridgemanVicki Trinitey Castillo 10/23/2013, 9:15 AM

## 2013-10-24 LAB — CBC
HEMATOCRIT: 31.6 % — AB (ref 36.0–46.0)
Hemoglobin: 10.7 g/dL — ABNORMAL LOW (ref 12.0–15.0)
MCH: 29.2 pg (ref 26.0–34.0)
MCHC: 33.9 g/dL (ref 30.0–36.0)
MCV: 86.3 fL (ref 78.0–100.0)
Platelets: 193 10*3/uL (ref 150–400)
RBC: 3.66 MIL/uL — AB (ref 3.87–5.11)
RDW: 14 % (ref 11.5–15.5)
WBC: 14.7 10*3/uL — AB (ref 4.0–10.5)

## 2013-10-24 NOTE — Lactation Note (Signed)
This note was copied from the chart of Deanna Lum Babeammarie Feenstra. Lactation Consultation Note C/O sore nipples. BF noted positioning not close enough to mom. Noted nipple pulling sideways from baby's head laying to the side. Encouraged mom to use pillows for positioning to keep baby elevated and head straight to mom and cheek and nose to touch mom's breast. Pulling sideways is causing the soreness. Instructed on chin tug. Comfort gels given, stated soothing. Encouraged to call for assistance if needed and to verify proper latch. Patient Name: Deanna Castillo MVHQI'OToday's Date: 10/24/2013 Reason for consult: Follow-up assessment;Breast/nipple pain   Maternal Data    Feeding Feeding Type: Breast Fed Length of feed: 40 min  LATCH Score/Interventions Latch: Repeated attempts needed to sustain latch, nipple held in mouth throughout feeding, stimulation needed to elicit sucking reflex.  Audible Swallowing: A few with stimulation  Type of Nipple: Everted at rest and after stimulation  Comfort (Breast/Nipple): Filling, red/small blisters or bruises, mild/mod discomfort  Problem noted: Mild/Moderate discomfort Interventions (Mild/moderate discomfort): Comfort gels  Hold (Positioning): Assistance needed to correctly position infant at breast and maintain latch. Intervention(s): Position options;Support Pillows;Skin to skin;Breastfeeding basics reviewed  LATCH Score: 6  Lactation Tools Discussed/Used Tools: Comfort gels   Consult Status Consult Status: Follow-up Follow-up type: In-patient    Charyl DancerLaura G Roselin Wiemann 10/24/2013, 6:06 AM

## 2013-10-24 NOTE — Lactation Note (Signed)
This note was copied from the chart of Deanna Castillo [place]. Lactation Consultation Note  Patient Name: Deanna Castillo Nesser WUJWJ'XToday's Date: 10/24/2013 Reason for consult: Follow-up assessment;Breast/nipple pain Mom reports some mild nipple tenderness. Mom had baby latched when I arrived, however, baby appeared to have shallow latch. Assisted Mom to latch using cross cradle and baby was able to obtain more depth. Demonstrated to parents how to bring bottom lip down for more depth. Basic teaching reviewed. Cluster feeding discussed. Care for sore nipples reviewed. Advised to call for assist as needed.   Maternal Data    Feeding Feeding Type: Breast Fed Length of feed: 20 min  LATCH Score/Interventions Latch: Grasps breast easily, tongue down, lips flanged, rhythmical sucking. Intervention(s): Assist with latch;Breast massage;Breast compression  Audible Swallowing: A few with stimulation Intervention(s): Hand expression  Type of Nipple: Everted at rest and after stimulation  Comfort (Breast/Nipple): Filling, red/small blisters or bruises, mild/mod discomfort  Problem noted: Mild/Moderate discomfort (EBM to sore nipples) Interventions (Mild/moderate discomfort): Comfort gels  Hold (Positioning): Assistance needed to correctly position infant at breast and maintain latch. Intervention(s): Breastfeeding basics reviewed;Support Pillows;Position options;Skin to skin  LATCH Score: 7  Lactation Tools Discussed/Used Tools: Comfort gels   Consult Status Consult Status: Follow-up Date: 10/25/13 Follow-up type: In-patient    Alfred LevinsKathy Ann Shaeley Segall 10/24/2013, 2:53 PM

## 2013-10-24 NOTE — Progress Notes (Signed)
Deanna Castillo  Post Partum Day 1:S/P SVD with unrepaired R Labial Laceration  Subjective: Patient up ad lib, denies syncope or dizziness. Reports pain managed with motrin.  Consuming regular diet without issues. Desires outpatient circumcision Feeding:  Breast-doing well Contraceptive plan:   Unsure  Objective: Blood pressure 101/65, pulse 78, temperature 97.8 F (36.6 C), temperature source Oral, resp. rate 18, height 5' 2.5" (1.588 m), weight 160 lb 2 oz (72.632 kg), last menstrual period 02/03/2013, SpO2 100.00%, unknown if currently breastfeeding.  Physical Exam:  General: alert, cooperative and no distress Lochia: appropriate Uterine Fundus: firm , Displaced to Right-at umbilicus Incision: N/A DVT Evaluation: No evidence of DVT seen on physical exam. Negative Homan's sign. No significant calf/ankle edema.   Recent Labs  10/22/13 1525 10/24/13 0614  HGB 11.3* 10.7*  HCT 33.3* 31.6*    Assessment S/P SVD Day 1 Normal Involution Breastfeeding  Plan: Plan for discharge tomorrow Continue current care Dr. Kathie RhodesS. Rivard to be updated on patient status   Gerrit HeckJessica Davi Kroon, CNM 10/24/2013, 8:36 AM

## 2013-10-24 NOTE — Progress Notes (Signed)
Ur chart review completed.  

## 2013-10-25 NOTE — Lactation Note (Signed)
This note was copied from the chart of Deanna Castillo [place]. Lactation Consultation Note: Follow up visit with mom before DC. Mom has baby in sidelying position and reports that he has just finished nursing for 10 minutes. Mom reports that nipples feel much better today now that she knows the right way to get him latched on. No questions at present. Reviewed [facility] and OP appointments as resources for support after DC To call prn  Patient Name: Deanna Castillo Mia WUJWJ'XToday's Date: 10/25/2013 Reason for consult: Follow-up assessment   Maternal Data Formula Feeding for Exclusion: No Does the patient have breastfeeding experience prior to this delivery?: No  Feeding Feeding Type: Breast Fed Length of feed: 20 min  LATCH Score/Interventions Latch: Grasps breast easily, tongue down, lips flanged, rhythmical sucking. Intervention(s): Adjust position;Assist with latch  Audible Swallowing: A few with stimulation  Type of Nipple: Everted at rest and after stimulation  Comfort (Breast/Nipple): Filling, red/small blisters or bruises, mild/mod discomfort  Problem noted: Mild/Moderate discomfort  Hold (Positioning): No assistance needed to correctly position infant at breast.  LATCH Score: 8  Lactation Tools Discussed/Used     Consult Status Consult Status: Complete    Pamelia HoitDonna D Ziza Hastings 10/25/2013, 8:38 AM

## 2013-10-25 NOTE — Discharge Instructions (Signed)
Postpartum Care After Vaginal Delivery °After you deliver your newborn (postpartum period), the usual stay in the hospital is 24 72 hours. If there were problems with your labor or delivery, or if you have other medical problems, you might be in the hospital longer.  °While you are in the hospital, you will receive help and instructions on how to care for yourself and your newborn during the postpartum period.  °While you are in the hospital: °· Be sure to tell your nurses if you have pain or discomfort, as well as where you feel the pain and what makes the pain worse. °· If you had an incision made near your vagina (episiotomy) or if you had some tearing during delivery, the nurses may put ice packs on your episiotomy or tear. The ice packs may help to reduce the pain and swelling. °· If you are breastfeeding, you may feel uncomfortable contractions of your uterus for a couple of weeks. This is normal. The contractions help your uterus get back to normal size. °· It is normal to have some bleeding after delivery. °· For the first 1 3 days after delivery, the flow is red and the amount may be similar to a period. °· It is common for the flow to start and stop. °· In the first few days, you may pass some small clots. Let your nurses know if you begin to pass large clots or your flow increases. °· Do not  flush blood clots down the toilet before having the nurse look at them. °· During the next 3 10 days after delivery, your flow should become more watery and pink or brown-tinged in color. °· Ten to fourteen days after delivery, your flow should be a small amount of yellowish-white discharge. °· The amount of your flow will decrease over the first few weeks after delivery. Your flow may stop in 6 8 weeks. Most women have had their flow stop by 12 weeks after delivery. °· You should change your sanitary pads frequently. °· Wash your hands thoroughly with soap and water for at least 20 seconds after changing pads, using  the toilet, or before holding or feeding your newborn. °· You should feel like you need to empty your bladder within the first 6 8 hours after delivery. °· In case you become weak, lightheaded, or faint, call your nurse before you get out of bed for the first time and before you take a shower for the first time. °· Within the first few days after delivery, your breasts may begin to feel tender and full. This is called engorgement. Breast tenderness usually goes away within 48 72 hours after engorgement occurs. You may also notice milk leaking from your breasts. If you are not breastfeeding, do not stimulate your breasts. Breast stimulation can make your breasts produce more milk. °· Spending as much time as possible with your newborn is very important. During this time, you and your newborn can feel close and get to know each other. Having your newborn stay in your room (rooming in) will help to strengthen the bond with your newborn.  It will give you time to get to know your newborn and become comfortable caring for your newborn. °· Your hormones change after delivery. Sometimes the hormone changes can temporarily cause you to feel sad or tearful. These feelings should not last more than a few days. If these feelings last longer than that, you should talk to your caregiver. °· If desired, talk to your caregiver about   methods of family planning or contraception. °· Talk to your caregiver about immunizations. Your caregiver may want you to have the following immunizations before leaving the hospital: °· Tetanus, diphtheria, and pertussis (Tdap) or tetanus and diphtheria (Td) immunization. It is very important that you and your family (including grandparents) or others caring for your newborn are up-to-date with the Tdap or Td immunizations. The Tdap or Td immunization can help protect your newborn from getting ill. °· Rubella immunization. °· Varicella (chickenpox) immunization. °· Influenza immunization. You should  receive this annual immunization if you did not receive the immunization during your pregnancy. °Document Released: 04/20/2007 Document Revised: 03/17/2012 Document Reviewed: 02/18/2012 °[employer]® Patient Information ©2014 [employer]. ° °Postpartum Depression and Baby Blues °The postpartum period begins right after the birth of a baby. During this time, there is often a great amount of joy and excitement. It is also a time of considerable changes in the life of the parent(s). Regardless of how many times a mother gives birth, each child brings new challenges and dynamics to the family. It is not unusual to have feelings of excitement accompanied by confusing shifts in moods, emotions, and thoughts. All mothers are at risk of developing postpartum depression or the "baby blues." These mood changes can occur right after giving birth, or they may occur many months after giving birth. The baby blues or postpartum depression can be mild or severe. Additionally, postpartum depression can resolve rather quickly, or it can be a long-term condition. °CAUSES °Elevated hormones and their rapid decline are thought to be a main cause of postpartum depression and the baby blues. There are a number of hormones that radically change during and after pregnancy. Estrogen and progesterone usually decrease immediately after delivering your baby. The level of thyroid hormone and various cortisol steroids also rapidly drop. Other factors that play a major role in these changes include major life events and genetics.  °RISK FACTORS °If you have any of the following risks for the baby blues or postpartum depression, know what symptoms to watch out for during the postpartum period. Risk factors that may increase the likelihood of getting the baby blues or postpartum depression include: °· Having a personal or family history of depression. °· Having depression while being pregnant. °· Having premenstrual or oral contraceptive-associated  mood issues. °· Having exceptional life stress. °· Having marital conflict. °· Lacking a social support network. °· Having a baby with special needs. °· Having health problems such as diabetes. °SYMPTOMS °Baby blues symptoms include: °· Brief fluctuations in mood, such as going from extreme happiness to sadness. °· Decreased concentration. °· Difficulty sleeping. °· Crying spells, tearfulness. °· Irritability. °· Anxiety. °Postpartum depression symptoms typically begin within the first month after giving birth. These symptoms include: °· Difficulty sleeping or excessive sleepiness. °· Marked weight loss. °· Agitation. °· Feelings of worthlessness. °· Lack of interest in activity or food. °Postpartum psychosis is a very concerning condition and can be dangerous. Fortunately, it is rare. Displaying any of the following symptoms is cause for immediate medical attention. Postpartum psychosis symptoms include: °· Hallucinations and delusions. °· Bizarre or disorganized behavior. °· Confusion or disorientation. °DIAGNOSIS  °A diagnosis is made by an evaluation of your symptoms. There are no medical or lab tests that lead to a diagnosis, but there are various questionnaires that a caregiver may use to identify those with the baby blues, postpartum depression, or psychosis. Often times, a screening tool called the Edinburgh Postnatal Depression Scale is used to diagnose   in the postpartum period.  TREATMENT The baby blues usually goes away on its own in 1 to 2 weeks. Social support is often all that is needed. You should be encouraged to get adequate sleep and rest. Occasionally, you may be given medicines to help you sleep.  Postpartum depression requires treatment as it can last several months or longer if it is not treated. Treatment may include individual or group therapy, medicine, or both to address any social, physiological, and psychological factors that may play a role in the depression. Regular  exercise, a healthy diet, rest, and social support may also be strongly recommended.  Postpartum psychosis is more serious and needs treatment right away. Hospitalization is often needed. HOME CARE INSTRUCTIONS  Get as much rest as you can. Nap when the baby sleeps.  Exercise regularly. Some women find yoga and walking to be beneficial.  Eat a balanced and nourishing diet.  Do little things that you enjoy. Have a cup of tea, take a bubble bath, read your favorite magazine, or listen to your favorite music.  Avoid alcohol.  Ask for help with household chores, cooking, grocery shopping, or running errands as needed. Do not try to do everything.  Talk to people close to you about how you are feeling. Get support from your partner, family members, friends, or other new moms.  Try to stay positive in how you think. Think about the things you are grateful for.  Do not spend a lot of time alone.  Only take medicine as directed by your caregiver.  Keep all your postpartum appointments.  Let your caregiver know if you have any concerns. SEEK MEDICAL CARE IF: You are having a reaction or problems with your medicine. SEEK IMMEDIATE MEDICAL CARE IF:  You have suicidal feelings.  You feel you may harm the baby or someone else. Document Released: 03/27/2004 Document Revised: 09/15/2011 Document Reviewed: 04/04/2013 [facility]Exit[facility] Patient Information 2014 [place], [place]. Medroxyprogesterone injection [Contraceptive] What is this medicine? MEDROXYPROGESTERONE (me DROX ee proe JES te rone) contraceptive injections prevent pregnancy. They provide effective birth control for 3 months. Depo-subQ Provera 104 is also used for treating pain related to endometriosis. This medicine may be used for other purposes; ask your health care provider or pharmacist if you have questions. COMMON BRAND NAME(S): Depo-Provera, Depo-subQ Provera 104 What should I tell my health care provider before I take this  medicine? They need to know if you have any of these conditions: -frequently drink alcohol -asthma -blood vessel disease or a history of a blood clot in the lungs or legs -bone disease such as osteoporosis -breast cancer -diabetes -eating disorder (anorexia nervosa or bulimia) -high blood pressure -HIV infection or AIDS -kidney disease -liver disease -mental depression -migraine -seizures (convulsions) -stroke -tobacco smoker -vaginal bleeding -an unusual or allergic reaction to medroxyprogesterone, other hormones, medicines, foods, dyes, or preservatives -pregnant or trying to get pregnant -breast-feeding How should I use this medicine? Depo-Provera Contraceptive injection is given into a muscle. Depo-subQ Provera 104 injection is given under the skin. These injections are given by a health care professional. You must not be pregnant before getting an injection. The injection is usually given during the first 5 days after the start of a menstrual period or 6 weeks after delivery of a baby. Talk to your pediatrician regarding the use of this medicine in children. Special care may be needed. These injections have been used in female children who have started having menstrual periods. Overdosage: If you think you have  taken too much of this medicine contact a poison control center or emergency room at once. NOTE: This medicine is only for you. Do not share this medicine with others. What if I miss a dose? Try not to miss a dose. You must get an injection once every 3 months to maintain birth control. If you cannot keep an appointment, call and reschedule it. If you wait longer than 13 weeks between Depo-Provera contraceptive injections or longer than 14 weeks between Depo-subQ Provera 104 injections, you could get pregnant. Use another method for birth control if you miss your appointment. You may also need a pregnancy test before receiving another injection. What may interact with this  medicine? Do not take this medicine with any of the following medications: -bosentan This medicine may also interact with the following medications: -aminoglutethimide -antibiotics or medicines for infections, especially rifampin, rifabutin, rifapentine, and griseofulvin -aprepitant -barbiturate medicines such as phenobarbital or primidone -bexarotene -carbamazepine -medicines for seizures like ethotoin, felbamate, oxcarbazepine, phenytoin, topiramate -modafinil -St. John's wort This list may not describe all possible interactions. Give your health care provider a list of all the medicines, herbs, non-prescription drugs, or dietary supplements you use. Also tell them if you smoke, drink alcohol, or use illegal drugs. Some items may interact with your medicine. What should I watch for while using this medicine? This drug does not protect you against HIV infection (AIDS) or other sexually transmitted diseases. Use of this product may cause you to lose calcium from your bones. Loss of calcium may cause weak bones (osteoporosis). Only use this product for more than 2 years if other forms of birth control are not right for you. The longer you use this product for birth control the more likely you will be at risk for weak bones. Ask your health care professional how you can keep strong bones. You may have a change in bleeding pattern or irregular periods. Many females stop having periods while taking this drug. If you have received your injections on time, your chance of being pregnant is very low. If you think you may be pregnant, see your health care professional as soon as possible. Tell your health care professional if you want to get pregnant within the next year. The effect of this medicine may last a long time after you get your last injection. What side effects may I notice from receiving this medicine? Side effects that you should report to your doctor or health care professional as soon as  possible: -allergic reactions like skin rash, itching or hives, swelling of the face, lips, or tongue -breast tenderness or discharge -breathing problems -changes in vision -depression -feeling faint or lightheaded, falls -fever -pain in the abdomen, chest, groin, or leg -problems with balance, talking, walking -unusually weak or tired -yellowing of the eyes or skin Side effects that usually do not require medical attention (report to your doctor or health care professional if they continue or are bothersome): -acne -fluid retention and swelling -headache -irregular periods, spotting, or absent periods -temporary pain, itching, or skin reaction at site where injected -weight gain This list may not describe all possible side effects. Call your doctor for medical advice about side effects. You may report side effects to FDA at 1-800-FDA-1088. Where should I keep my medicine? This does not apply. The injection will be given to you by a health care professional. NOTE: This sheet is a summary. It may not cover all possible information. If you have questions about this medicine, talk to  your doctor, pharmacist, or health care provider.  2014, [employer]. (2008-07-14 18:37:56) Iron-Rich Diet An iron-rich diet contains foods that are good sources of iron. Iron is an important mineral that helps your body produce hemoglobin. Hemoglobin is a protein in red blood cells that carries oxygen to the body's tissues. Sometimes, the iron level in your blood can be low. This may be caused by:  A lack of iron in your diet.  Blood loss.  Times of growth, such as during pregnancy or during a child's growth and development. Low levels of iron can cause a decrease in the number of red blood cells. This can result in iron deficiency anemia. Iron deficiency anemia symptoms include:  Tiredness.  Weakness.  Irritability.  Increased chance of infection. Here are some recommendations for  daily iron intake:  Males older than 23 years of age need 8 mg of iron per day.  Women ages 33 to 68 need 18 mg of iron per day.  Pregnant women need 27 mg of iron per day, and women who are over 26 years of age and breastfeeding need 9 mg of iron per day.  Women over the age of 65 need 8 mg of iron per day. SOURCES OF IRON There are 2 types of iron that are found in food: heme iron and nonheme iron. Heme iron is absorbed by the body better than nonheme iron. Heme iron is found in meat, poultry, and fish. Nonheme iron is found in grains, beans, and vegetables. Heme Iron Sources Food / Iron (mg)  Chicken liver, 3 oz (85 g)/ 10 mg  Beef liver, 3 oz (85 g)/ 5.5 mg  Oysters, 3 oz (85 g)/ 8 mg  Beef, 3 oz (85 g)/ 2 to 3 mg  Shrimp, 3 oz (85 g)/ 2.8 mg  [place], 3 oz (85 g)/ 2 mg  Chicken, 3 oz (85 g) / 1 mg  Fish (tuna, halibut), 3 oz (85 g)/ 1 mg  Pork, 3 oz (85 g)/ 0.9 mg Nonheme Iron Sources Food / Iron (mg)  Ready-to-eat breakfast cereal, iron-fortified / 3.9 to 7 mg  Tofu,  cup / 3.4 mg  Kidney beans,  cup / 2.6 mg  Baked potato with skin / 2.7 mg  Asparagus,  cup / 2.2 mg  Avocado / 2 mg  Dried peaches,  cup / 1.6 mg  Raisins,  cup / 1.5 mg  Soy milk, 1 cup / 1.5 mg  Whole-wheat bread, 1 slice / 1.2 mg  Spinach, 1 cup / 0.8 mg  Broccoli,  cup / 0.6 mg IRON ABSORPTION Certain foods can decrease the body's absorption of iron. Try to avoid these foods and beverages while eating meals with iron-containing foods:  Coffee.  Tea.  Fiber.  Soy. Foods containing vitamin C can help increase the amount of iron your body absorbs from iron sources, especially from nonheme sources. Eat foods with vitamin C along with iron-containing foods to increase your iron absorption. Foods that are high in vitamin C include many fruits and vegetables. Some good sources are:  Fresh orange juice.  Oranges.  Strawberries.  Mangoes.  Grapefruit.  Red bell  peppers.  Green bell peppers.  Broccoli.  Potatoes with skin.  Tomato juice. Document Released: 02/04/2005 Document Revised: 09/15/2011 Document Reviewed: 12/12/2010 [facility] Patient Information 2014 [place], [place].

## 2013-10-25 NOTE — Discharge Summary (Signed)
Vaginal Delivery Discharge Summary  Deanna Castillo  DOB:    24-Jan-1991 MRN:    [id number] CSN:    [id number]  Date of admission:                  10/22/2013  Date of discharge:                   10/25/2013  Procedures this admission: SVD  Date of Delivery: 10/23/2013  Newborn Data:  Live born female  Birth Weight: 6 lb 1.9 oz (2775 g) APGAR: 8, 9  Home with mother. Name: Antonio Circumcision Plan: Outpatient  History of Present Illness:  [name] is a 23 y.o. female, G1P1001, who presents at 727w6d weeks gestation. The [facility]. She was admitted onset of labor. Her pregnancy has been complicated by:  Patient Active Problem List   Diagnosis Date Noted  . Vaginal delivery 10/23/2013  . 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 Anemia--Hgb 10.7 at 28 weeks 10/22/2013  . Asthma, chronic 10/22/2013   Hospital course:  The patient was admitted for active labor.   Her labor was not complicated. She proceeded to have a vaginal delivery of a healthy infant. Her delivery was not complicated. Her postpartum course was not complicated. She was discharged to home on postpartum day 2 doing well.  Feeding:  breast  Contraception:  Depo-Provera-Requests to receive at 6wk PPV  Discharge hemoglobin:  Hemoglobin  Date Value Ref Range Status  10/24/2013 10.7* 12.0 - 15.0 g/dL Final     HCT  Date Value Ref Range Status  10/24/2013 31.6* 36.0 - 46.0 % Final    Discharge Physical Exam:   General: alert, cooperative and no distress Lochia: appropriate Uterine Fundus: firm Incision: N/A DVT Evaluation: No evidence of DVT seen on physical exam. Negative Homan's sign. No significant calf/ankle edema.  Intrapartum Procedures: spontaneous vaginal delivery Postpartum Procedures: none Complications-Operative and Postpartum: R hemostatic labial laceration  Discharge  Diagnoses: Term Pregnancy-delivered  Discharge Information:  Activity:           pelvic rest Diet:                routine Medications: None Condition:      stable Instructions:   Postpartum Care After Vaginal Delivery  After you deliver your newborn (postpartum period), the usual stay in the hospital is 24 72 hours. If there were problems with your labor or delivery, or if you have other medical problems, you might be in the hospital longer.  While you are in the hospital, you will receive help and instructions on how to care for yourself and your newborn during the postpartum period.  While you are in the hospital:  Be sure to tell your nurses if you have pain or discomfort, as well as where you feel the pain and what makes the pain worse.  If you had an incision made near your vagina (episiotomy) or if you had some tearing during delivery, the nurses may put ice packs on your episiotomy or tear. The ice packs may help to reduce the pain and swelling.  If you are breastfeeding, you may feel uncomfortable contractions of your uterus for a couple of weeks. This is normal. The contractions help your uterus get back to normal size.  It is normal to have some bleeding after delivery.  For the first 1 3 days after delivery, the flow is red  and the amount may be similar to a period.  It is common for the flow to start and stop.  In the first few days, you may pass some small clots. Let your nurses know if you begin to pass large clots or your flow increases.  Do not  flush blood clots down the toilet before having the nurse look at them.  During the next 3 10 days after delivery, your flow should become more watery and pink or brown-tinged in color.  Ten to fourteen days after delivery, your flow should be a small amount of yellowish-white discharge.  The amount of your flow will decrease over the first few weeks after delivery. Your flow may stop in 6 8 weeks. Most women have had their  flow stop by 12 weeks after delivery.  You should change your sanitary pads frequently.  Wash your hands thoroughly with soap and water for at least 20 seconds after changing pads, using the toilet, or before holding or feeding your newborn.  You should feel like you need to empty your bladder within the first 6 8 hours after delivery.  In case you become weak, lightheaded, or faint, call your nurse before you get out of bed for the first time and before you take a shower for the first time.  Within the first few days after delivery, your breasts may begin to feel tender and full. This is called engorgement. Breast tenderness usually goes away within 48 72 hours after engorgement occurs. You may also notice milk leaking from your breasts. If you are not breastfeeding, do not stimulate your breasts. Breast stimulation can make your breasts produce more milk.  Spending as much time as possible with your newborn is very important. During this time, you and your newborn can feel close and get to know each other. Having your newborn stay in your room (rooming in) will help to strengthen the bond with your newborn. It will give you time to get to know your newborn and become comfortable caring for your newborn.  Your hormones change after delivery. Sometimes the hormone changes can temporarily cause you to feel sad or tearful. These feelings should not last more than a few days. If these feelings last longer than that, you should talk to your caregiver.  If desired, talk to your caregiver about methods of family planning or contraception.  Talk to your caregiver about immunizations. Your caregiver may want you to have the following immunizations before leaving the hospital:  Tetanus, diphtheria, and pertussis (Tdap) or tetanus and diphtheria (Td) immunization. It is very important that you and your family (including grandparents) or others caring for your newborn are up-to-date with the Tdap or Td  immunizations. The Tdap or Td immunization can help protect your newborn from getting ill.  Rubella immunization.  Varicella (chickenpox) immunization.  Influenza immunization. You should receive this annual immunization if you did not receive the immunization during your pregnancy. Document Released: 04/20/2007 Document Revised: 03/17/2012 Document Reviewed: 02/18/2012 [facility] Patient Information 2014 [place], [place].   Postpartum Depression and Baby Blues  The postpartum period begins right after the birth of a baby. During this time, there is often a great amount of joy and excitement. It is also a time of considerable changes in the life of the parent(s). Regardless of how many times a mother gives birth, each child brings new challenges and dynamics to the family. It is not unusual to have feelings of excitement accompanied by confusing shifts in moods, emotions,  and thoughts. All mothers are at risk of developing postpartum depression or the "baby blues." These mood changes can occur right after giving birth, or they may occur many months after giving birth. The baby blues or postpartum depression can be mild or severe. Additionally, postpartum depression can resolve rather quickly, or it can be a long-term condition. CAUSES Elevated hormones and their rapid decline are thought to be a main cause of postpartum depression and the baby blues. There are a number of hormones that radically change during and after pregnancy. Estrogen and progesterone usually decrease immediately after delivering your baby. The level of thyroid hormone and various cortisol steroids also rapidly drop. Other factors that play a major role in these changes include major life events and genetics.  RISK FACTORS If you have any of the following risks for the baby blues or postpartum depression, know what symptoms to watch out for during the postpartum period. Risk factors that may increase the likelihood of getting the  baby blues or postpartum depression include:  Havinga personal or family history of depression.  Having depression while being pregnant.  Having premenstrual or oral contraceptive-associated mood issues.  Having exceptional life stress.  Having marital conflict.  Lacking a social support network.  Having a baby with special needs.  Having health problems such as diabetes. SYMPTOMS Baby blues symptoms include:  Brief fluctuations in mood, such as going from extreme happiness to sadness.  Decreased concentration.  Difficulty sleeping.  Crying spells, tearfulness.  Irritability.  Anxiety. Postpartum depression symptoms typically begin within the first month after giving birth. These symptoms include:  Difficulty sleeping or excessive sleepiness.  Marked weight loss.  Agitation.  Feelings of worthlessness.  Lack of interest in activity or food. Postpartum psychosis is a very concerning condition and can be dangerous. Fortunately, it is rare. Displaying any of the following symptoms is cause for immediate medical attention. Postpartum psychosis symptoms include:  Hallucinations and delusions.  Bizarre or disorganized behavior.  Confusion or disorientation. DIAGNOSIS  A diagnosis is made by an evaluation of your symptoms. There are no medical or lab tests that lead to a diagnosis, but there are various questionnaires that a caregiver may use to identify those with the baby blues, postpartum depression, or psychosis. Often times, a screening tool called the [place] Postnatal Depression Scale is used to diagnose depression in the postpartum period.  TREATMENT The baby blues usually goes away on its own in 1 to 2 weeks. Social support is often all that is needed. You should be encouraged to get adequate sleep and rest. Occasionally, you may be given medicines to help you sleep.  Postpartum depression requires treatment as it can last several months or longer if it is  not treated. Treatment may include individual or group therapy, medicine, or both to address any social, physiological, and psychological factors that may play a role in the depression. Regular exercise, a healthy diet, rest, and social support may also be strongly recommended.  Postpartum psychosis is more serious and needs treatment right away. Hospitalization is often needed. HOME CARE INSTRUCTIONS  Get as much rest as you can. Nap when the baby sleeps.  Exercise regularly. Some women find yoga and walking to be beneficial.  Eat a balanced and nourishing diet.  Do little things that you enjoy. Have a cup of tea, take a bubble bath, read your favorite magazine, or listen to your favorite music.  Avoid alcohol.  Ask for help with household chores, cooking, grocery shopping, or  running errands as needed. Do not try to do everything.  Talk to people close to you about how you are feeling. Get support from your partner, family members, friends, or other new moms.  Try to stay positive in how you think. Think about the things you are grateful for.  Do not spend a lot of time alone.  Only take medicine as directed by your caregiver.  Keep all your postpartum appointments.  Let your caregiver know if you have any concerns. SEEK MEDICAL CARE IF: You are having a reaction or problems with your medicine. SEEK IMMEDIATE MEDICAL CARE IF:  You have suicidal feelings.  You feel you may harm the baby or someone else. Document Released: 03/27/2004 Document Revised: 09/15/2011 Document Reviewed: 04/29/2011 [facility] Patient Information 2014 [place].   Discharge to: home  Follow-up Information   [facility] In 6 weeks. (Please call if you have any questions or concerns prior to you next visit. )    Specialty:  Obstetrics and Gynecology   Contact information:   [address] [place] [id number] [phone]        Gerrit HeckJessica Dorena Dorfman , CNM, MSN 10/25/2013

## 2014-05-08 ENCOUNTER — Encounter (HOSPITAL_COMMUNITY): Payer: Self-pay

## 2015-12-31 ENCOUNTER — Inpatient Hospital Stay (HOSPITAL_COMMUNITY)
Admission: AD | Admit: 2015-12-31 | Discharge: 2015-12-31 | Disposition: A | Discharge status: Home / Self Care | Payer: Medicaid Other | Source: Ambulatory Visit | Attending: Obstetrics and Gynecology | Admitting: Obstetrics and Gynecology

## 2015-12-31 ENCOUNTER — Inpatient Hospital Stay (HOSPITAL_COMMUNITY): Payer: Self-pay

## 2015-12-31 ENCOUNTER — Encounter (HOSPITAL_COMMUNITY): Payer: Self-pay | Admitting: Student

## 2015-12-31 DIAGNOSIS — O219 Vomiting of pregnancy, unspecified: Secondary | ICD-10-CM | POA: Insufficient documentation

## 2015-12-31 DIAGNOSIS — O99511 Diseases of the respiratory system complicating pregnancy, first trimester: Secondary | ICD-10-CM | POA: Insufficient documentation

## 2015-12-31 DIAGNOSIS — Z3491 Encounter for supervision of normal pregnancy, unspecified, first trimester: Secondary | ICD-10-CM

## 2015-12-31 DIAGNOSIS — R109 Unspecified abdominal pain: Secondary | ICD-10-CM | POA: Diagnosis not present

## 2015-12-31 DIAGNOSIS — J45909 Unspecified asthma, uncomplicated: Secondary | ICD-10-CM | POA: Insufficient documentation

## 2015-12-31 DIAGNOSIS — O26899 Other specified pregnancy related conditions, unspecified trimester: Secondary | ICD-10-CM

## 2015-12-31 DIAGNOSIS — O9989 Other specified diseases and conditions complicating pregnancy, childbirth and the puerperium: Secondary | ICD-10-CM

## 2015-12-31 DIAGNOSIS — O26891 Other specified pregnancy related conditions, first trimester: Secondary | ICD-10-CM | POA: Insufficient documentation

## 2015-12-31 DIAGNOSIS — Z3A10 10 weeks gestation of pregnancy: Secondary | ICD-10-CM | POA: Insufficient documentation

## 2015-12-31 DIAGNOSIS — R1030 Lower abdominal pain, unspecified: Secondary | ICD-10-CM | POA: Insufficient documentation

## 2015-12-31 LAB — URINE MICROSCOPIC-ADD ON

## 2015-12-31 LAB — WET PREP, GENITAL
CLUE CELLS WET PREP: NONE SEEN
SPERM: NONE SEEN
Trich, Wet Prep: NONE SEEN
Yeast Wet Prep HPF POC: NONE SEEN

## 2015-12-31 LAB — URINALYSIS, ROUTINE W REFLEX MICROSCOPIC
GLUCOSE, UA: NEGATIVE mg/dL
HGB URINE DIPSTICK: NEGATIVE
Ketones, ur: NEGATIVE mg/dL
LEUKOCYTES UA: NEGATIVE
Nitrite: NEGATIVE
PH: 6 (ref 5.0–8.0)
Protein, ur: 30 mg/dL — AB
Specific Gravity, Urine: 1.025 (ref 1.005–1.030)

## 2015-12-31 LAB — HCG, QUANTITATIVE, PREGNANCY: hCG, Beta Chain, Quant, S: 79852 m[IU]/mL — ABNORMAL HIGH (ref <5)

## 2015-12-31 LAB — CBC
HCT: 37 % (ref 36.0–46.0)
HEMOGLOBIN: 12.5 g/dL (ref 12.0–15.0)
MCH: 28.5 pg (ref 26.0–34.0)
MCHC: 33.8 g/dL (ref 30.0–36.0)
MCV: 84.3 fL (ref 78.0–100.0)
Platelets: 293 10*3/uL (ref 150–400)
RBC: 4.39 MIL/uL (ref 3.87–5.11)
RDW: 14.6 % (ref 11.5–15.5)
WBC: 6.7 10*3/uL (ref 4.0–10.5)

## 2015-12-31 LAB — POCT PREGNANCY, URINE: Preg Test, Ur: POSITIVE — AB

## 2015-12-31 MED ORDER — PROMETHAZINE HCL 25 MG PO TABS: 25.0000 mg | ORAL_TABLET | Freq: Four times a day (QID) | ORAL | Status: DC | PRN

## 2015-12-31 MED ORDER — METOCLOPRAMIDE HCL 10 MG PO TABS
10.0000 mg | ORAL_TABLET | Freq: Once | ORAL | Status: AC
  Administered 2015-12-31: 10 mg via ORAL
  Filled 2015-12-31: qty 1

## 2015-12-31 NOTE — MAU Provider Note (Signed)
History     CSN: [id number]  Arrival date and time: 12/31/15 1333   First Provider Initiated Contact with Patient 12/31/15 1555      Chief Complaint  Patient presents with  . Abdominal Pain  . Emesis During Pregnancy   HPI Deanna Castillo is a 25 y.o. G2P1001 at 2439w2d who presents with abdominal pain & n/v. Reports abdominal pain x 2 days. Lower abdominal pain, describes as sharp & cramp like. Intermittent; worse in the morning. Rates 6/10. No treatment. No aggravating or alleviating factors.  Nausea/vomiting x 1 week. Has vomited 3 times in the last 24 hours. Nauseated all day long. Last tried to eat last night, kept down bag of chips. Has been drinking water & orange juice - trouble keeping fluids down. Reports 1 episode of watery stool today.  Denies vaginal bleeding. Vaginal discharge - white & thick; no vaginal irritation.  Denies fever or dysuria.   OB History    Gravida Para Term Preterm AB TAB SAB Ectopic Multiple Living   2 1 1       1       Past Medical History  Diagnosis Date  . Asthma     Past Surgical History  Procedure Laterality Date  . No past surgeries      History reviewed. No pertinent family history.  Social History  Substance Use Topics  . Smoking status: Never Smoker   . Smokeless tobacco: Never Used  . Alcohol Use: No     Comment: Rarely    Allergies: No Known Allergies  Prescriptions prior to admission  Medication Sig Dispense Refill Last Dose  . acetaminophen (TYLENOL) 500 MG tablet Take 1,000 mg by mouth every 6 (six) hours as needed for moderate pain.   2 days  . Prenatal Vit-Fe Fumarate-FA (PRENATAL MULTIVITAMIN) TABS tablet Take 1 tablet by mouth daily at 12 noon.   2 days    Review of Systems  Constitutional: Negative.   Gastrointestinal: Positive for nausea, vomiting, abdominal pain and diarrhea. Negative for heartburn, constipation and blood in stool.  Genitourinary: Negative for dysuria.       + vaginal discharge No vaginal  bleeding   Physical Exam   Blood pressure 108/66, pulse 85, temperature 98.2 F (36.8 C), temperature source Oral, resp. rate 16, height 5\' 4"  (1.626 m), weight 155 lb (70.308 kg), last menstrual period 10/20/2015, unknown if currently breastfeeding.  Physical Exam  Nursing note and vitals reviewed. Constitutional: She is oriented to person, place, and time. She appears well-developed and well-nourished. No distress.  HENT:  Head: Normocephalic and atraumatic.  Eyes: Conjunctivae are normal. Right eye exhibits no discharge. Left eye exhibits no discharge. No scleral icterus.  Neck: Normal range of motion.  Cardiovascular: Normal rate, regular rhythm and normal heart sounds.   No murmur heard. Respiratory: Effort normal and breath sounds normal. No respiratory distress. She has no wheezes.  GI: Soft. Bowel sounds are normal. She exhibits no distension. There is no tenderness.  Genitourinary: Uterus normal. Cervix exhibits no motion tenderness and no friability. Right adnexum displays no mass, no tenderness and no fullness. Left adnexum displays no mass, no tenderness and no fullness. No bleeding in the vagina. Vaginal discharge (minimal amount of yellow mucoid discharge) found.  Neurological: She is alert and oriented to person, place, and time.  Skin: Skin is warm and dry. She is not diaphoretic.  Psychiatric: She has a normal mood and affect. Her behavior is normal. Judgment and thought content normal.  MAU Course  Procedures Results for orders placed or performed during the hospital encounter of 12/31/15 (from the past 24 hour(s))  Urinalysis, Routine w reflex microscopic (not at [facility])     Status: Abnormal   Collection Time: 12/31/15  2:18 PM  Result Value Ref Range   Color, Urine YELLOW YELLOW   APPearance CLEAR CLEAR   Specific Gravity, Urine 1.025 1.005 - 1.030   pH 6.0 5.0 - 8.0   Glucose, UA NEGATIVE NEGATIVE mg/dL   Hgb urine dipstick NEGATIVE NEGATIVE   Bilirubin Urine  SMALL (A) NEGATIVE   Ketones, ur NEGATIVE NEGATIVE mg/dL   Protein, ur 30 (A) NEGATIVE mg/dL   Nitrite NEGATIVE NEGATIVE   Leukocytes, UA NEGATIVE NEGATIVE  Urine microscopic-add on     Status: Abnormal   Collection Time: 12/31/15  2:18 PM  Result Value Ref Range   Squamous Epithelial / LPF 0-5 (A) NONE SEEN   WBC, UA 0-5 0 - 5 WBC/hpf   RBC / HPF 0-5 0 - 5 RBC/hpf   Bacteria, UA MANY (A) NONE SEEN   Urine-Other MUCOUS PRESENT   Pregnancy, urine POC     Status: Abnormal   Collection Time: 12/31/15  3:03 PM  Result Value Ref Range   Preg Test, Ur POSITIVE (A) NEGATIVE  CBC     Status: None   Collection Time: 12/31/15  3:22 PM  Result Value Ref Range   WBC 6.7 4.0 - 10.5 K/uL   RBC 4.39 3.87 - 5.11 MIL/uL   Hemoglobin 12.5 12.0 - 15.0 g/dL   HCT 82.9 56.2 - 13.0 %   MCV 84.3 78.0 - 100.0 fL   MCH 28.5 26.0 - 34.0 pg   MCHC 33.8 30.0 - 36.0 g/dL   RDW 86.5 78.4 - 69.6 %   Platelets 293 150 - 400 K/uL  hCG, quantitative, pregnancy     Status: Abnormal   Collection Time: 12/31/15  3:22 PM  Result Value Ref Range   hCG, Beta Chain, Quant, S 29528 (H) <5 mIU/mL  Wet prep, genital     Status: Abnormal   Collection Time: 12/31/15  4:14 PM  Result Value Ref Range   Yeast Wet Prep HPF POC NONE SEEN NONE SEEN   Trich, Wet Prep NONE SEEN NONE SEEN   Clue Cells Wet Prep HPF POC NONE SEEN NONE SEEN   WBC, Wet Prep HPF POC MANY (A) NONE SEEN   Sperm NONE SEEN    US Ob Comp Less 14 Wks  12/31/2015  CLINICAL DATA:  Cramping and sharp left pain for 2 days. EXAM: OBSTETRIC <14 WK [place] AND TRANSVAGINAL OB US TECHNIQUE: Both transabdominal and transvaginal ultrasound examinations were performed for complete evaluation of the gestation as well as the maternal uterus, adnexal regions, and pelvic cul-de-sac. Transvaginal technique was performed to assess early pregnancy. COMPARISON:  None. FINDINGS: Intrauterine gestational sac: A single intrauterine pregnancy/gestational sac is identified. Yolk sac:   A single yolk sac is seen. Embryo:  A single embryo is identified. Cardiac Activity: Present. Heart Rate: 150  bpm CRL: 1.1 cm mm 7 w 1 d [place] EDC: August 17, 2016 Subchorionic hemorrhage:  None visualized. Maternal uterus/adnexae: The ovaries are unremarkable. IMPRESSION: Single live intrauterine pregnancy. Electronically Signed   By: Gerome Sam III M.D   On: 12/31/2015 17:02   US Ob Transvaginal  12/31/2015  CLINICAL DATA:  Cramping and sharp left pain for 2 days. EXAM: OBSTETRIC <14 WK [place] AND TRANSVAGINAL OB US TECHNIQUE: Both transabdominal and  transvaginal ultrasound examinations were performed for complete evaluation of the gestation as well as the maternal uterus, adnexal regions, and pelvic cul-de-sac. Transvaginal technique was performed to assess early pregnancy. COMPARISON:  None. FINDINGS: Intrauterine gestational sac: A single intrauterine pregnancy/gestational sac is identified. Yolk sac:  A single yolk sac is seen. Embryo:  A single embryo is identified. Cardiac Activity: Present. Heart Rate: 150  bpm CRL: 1.1 cm mm 7 w 1 d US EDC: August 17, 2016 Subchorionic hemorrhage:  None visualized. Maternal uterus/adnexae: The ovaries are unremarkable. IMPRESSION: Single live intrauterine pregnancy. Electronically Signed   By: Gerome Samavid  Williams III M.D   On: 12/31/2015 17:02     MDM UPT positive O positive Ultrasound shows SIUP @ 4122w1d with cardiac activity Reglan 10 mg PO Assessment and Plan  A: 1. Normal IUP (intrauterine pregnancy) on prenatal ultrasound, first trimester   2. Abdominal pain during pregnancy   3. Nausea and vomiting during pregnancy prior to [redacted] weeks gestation     P: Discharge home Rx promethazine Pregnancy verification letter & provider list given Discussed reasons to return to [facility]U Start prenatal care  Deanna Castillo 12/31/2015, 3:54 PM

## 2015-12-31 NOTE — Discharge Instructions (Signed)
Abdominal Pain During Pregnancy Belly (abdominal) pain is common during pregnancy. Most of the time, it is not a serious problem. Other times, it can be a sign that something is wrong with the pregnancy. Always tell your doctor if you have belly pain. HOME CARE Monitor your belly pain for any changes. The following actions may help you feel better:  Do not have sex (intercourse) or put anything in your vagina until you feel better.  Rest until your pain stops.  Drink clear fluids if you feel sick to your stomach (nauseous). Do not eat solid food until you feel better.  Only take medicine as told by your doctor.  Keep all doctor visits as told. GET HELP RIGHT AWAY IF:   You are bleeding, leaking fluid, or pieces of tissue come out of your vagina.  You have more pain or cramping.  You keep throwing up (vomiting).  You have pain when you pee (urinate) or have blood in your pee.  You have a fever.  You do not feel your baby moving as much.  You feel very weak or feel like passing out.  You have trouble breathing, with or without belly pain.  You have a very bad headache and belly pain.  You have fluid leaking from your vagina and belly pain.  You keep having watery poop (diarrhea).  Your belly pain does not go away after resting, or the pain gets worse. MAKE SURE YOU:   Understand these instructions.  Will watch your condition.  Will get help right away if you are not doing well or get worse.   This information is not intended to replace advice given to you by your health care provider. Make sure you discuss any questions you have with your health care provider.   Document Released: 06/11/2009 Document Revised: 02/23/2013 Document Reviewed: 01/20/2013 Elsevier Interactive Patient Education 2016 Elsevier Inc. Morning Sickness Morning sickness is when you feel sick to your stomach (nauseous) during pregnancy. This nauseous feeling may or may not come with vomiting. It  often occurs in the morning but can be a problem any time of day. Morning sickness is most common during the first trimester, but it may continue throughout pregnancy. While morning sickness is unpleasant, it is usually harmless unless you develop severe and continual vomiting (hyperemesis gravidarum). This condition requires more intense treatment.  CAUSES  The cause of morning sickness is not completely known but seems to be related to normal hormonal changes that occur in pregnancy. RISK FACTORS You are at greater risk if you:  Experienced nausea or vomiting before your pregnancy.  Had morning sickness during a previous pregnancy.  Are pregnant with more than one baby, such as twins. TREATMENT  Do not use any medicines (prescription, over-the-counter, or herbal) for morning sickness without first talking to your health care provider. Your health care provider may prescribe or recommend:  Vitamin B6 supplements.  Anti-nausea medicines.  The herbal medicine ginger. HOME CARE INSTRUCTIONS   Only take over-the-counter or prescription medicines as directed by your health care provider.  Taking multivitamins before getting pregnant can prevent or decrease the severity of morning sickness in most women.  Eat a piece of dry toast or unsalted crackers before getting out of bed in the morning.  Eat five or six small meals a day.  Eat dry and bland foods (rice, baked potato). Foods high in carbohydrates are often helpful.  Do not drink liquids with your meals. Drink liquids between meals.  Avoid greasy,  fatty, and spicy foods.  Get someone to cook for you if the smell of any food causes nausea and vomiting.  If you feel nauseous after taking prenatal vitamins, take the vitamins at night or with a snack.  Snack on protein foods (nuts, yogurt, cheese) between meals if you are hungry.  Eat unsweetened gelatins for desserts.  Wearing an acupressure wristband (worn for sea sickness)  may be helpful.  Acupuncture may be helpful.  Do not smoke.  Get a humidifier to keep the air in your house free of odors.  Get plenty of fresh air. SEEK MEDICAL CARE IF:   Your home remedies are not working, and you need medicine.  You feel dizzy or lightheaded.  You are losing weight. SEEK IMMEDIATE MEDICAL CARE IF:   You have persistent and uncontrolled nausea and vomiting.  You pass out (faint). MAKE SURE YOU:  Understand these instructions.  Will watch your condition.  Will get help right away if you are not doing well or get worse.   This information is not intended to replace advice given to you by your health care provider. Make sure you discuss any questions you have with your health care provider.   Document Released: 08/14/2006 Document Revised: 06/28/2013 Document Reviewed: 12/08/2012 Elsevier Interactive Patient Education [employer] Elsevier Inc.

## 2015-12-31 NOTE — MAU Note (Signed)
Pt C/O intense lower abd pain x 2 days, vomiting for the last week, also diarrhea.  One diarrhea stool today.  Denies bleeding.  Pos HPT last week.

## 2016-01-01 LAB — HIV ANTIBODY (ROUTINE TESTING W REFLEX): HIV Screen 4th Generation wRfx: NONREACTIVE

## 2016-01-01 LAB — GC/CHLAMYDIA PROBE AMP (~~LOC~~) NOT AT ARMC
Chlamydia: NEGATIVE
NEISSERIA GONORRHEA: NEGATIVE

## 2016-01-13 ENCOUNTER — Inpatient Hospital Stay (HOSPITAL_COMMUNITY)
Admission: AD | Admit: 2016-01-13 | Discharge: 2016-01-14 | Disposition: A | Discharge status: Home / Self Care | Payer: Medicaid Other | Source: Ambulatory Visit | Attending: Obstetrics & Gynecology | Admitting: Obstetrics & Gynecology

## 2016-01-13 ENCOUNTER — Encounter (HOSPITAL_COMMUNITY): Payer: Self-pay

## 2016-01-13 DIAGNOSIS — O99611 Diseases of the digestive system complicating pregnancy, first trimester: Secondary | ICD-10-CM | POA: Insufficient documentation

## 2016-01-13 DIAGNOSIS — K219 Gastro-esophageal reflux disease without esophagitis: Secondary | ICD-10-CM | POA: Diagnosis not present

## 2016-01-13 DIAGNOSIS — O219 Vomiting of pregnancy, unspecified: Secondary | ICD-10-CM | POA: Insufficient documentation

## 2016-01-13 DIAGNOSIS — Z3A09 9 weeks gestation of pregnancy: Secondary | ICD-10-CM | POA: Diagnosis not present

## 2016-01-13 LAB — URINE MICROSCOPIC-ADD ON

## 2016-01-13 LAB — URINALYSIS, ROUTINE W REFLEX MICROSCOPIC
BILIRUBIN URINE: NEGATIVE
GLUCOSE, UA: NEGATIVE mg/dL
Ketones, ur: 15 mg/dL — AB
Nitrite: NEGATIVE
PH: 6.5 (ref 5.0–8.0)
Protein, ur: NEGATIVE mg/dL
SPECIFIC GRAVITY, URINE: 1.015 (ref 1.005–1.030)

## 2016-01-13 NOTE — MAU Note (Signed)
Pt here with N/V, been going on for about 3 weeks.

## 2016-01-14 DIAGNOSIS — O219 Vomiting of pregnancy, unspecified: Secondary | ICD-10-CM

## 2016-01-14 MED ORDER — METOCLOPRAMIDE HCL 10 MG PO TABS
10.0000 mg | ORAL_TABLET | Freq: Once | ORAL | Status: AC
  Administered 2016-01-14: 10 mg via ORAL
  Filled 2016-01-14: qty 1

## 2016-01-14 MED ORDER — METOCLOPRAMIDE HCL 10 MG PO TABS: 10.0000 mg | ORAL_TABLET | Freq: Four times a day (QID) | ORAL | Status: DC | PRN

## 2016-01-14 MED ORDER — FAMOTIDINE 20 MG PO TABS
20.0000 mg | ORAL_TABLET | Freq: Once | ORAL | Status: AC
  Administered 2016-01-14: 20 mg via ORAL
  Filled 2016-01-14: qty 1

## 2016-01-14 MED ORDER — FAMOTIDINE 20 MG PO TABS: 20.0000 mg | ORAL_TABLET | Freq: Two times a day (BID) | ORAL | Status: DC

## 2016-01-14 NOTE — MAU Provider Note (Signed)
Chief Complaint: Emesis During Pregnancy; Dizziness; and Abdominal Cramping   First Provider Initiated Contact with Patient 01/14/16 0037     SUBJECTIVE HPI: Deanna Castillo is a 25 y.o. G2P1001 at [redacted]w[redacted]d who presents to [facility] reporting persistent N/V or pregnancy and seeign streaks of blood in emesis today. Has Rx for phenergan which helps, but it makes hr too tired to work, so she hasn't been using it. Also reports epigastric pain. X several days that is worse right before she vomits. Pt reports vomiting 3-4 x per day.   Hasn't started [facility], but has list of providers.   Location: Epigastric Quality: Cramping Severity: 4/10 on pain scale Duration: Several days Context: Before vomiting Timing: interittent Modifying factors: Improves after vomiting.  Associated signs and symptoms: Pos for nausea, vomiting, scant hematemesis. Neg for fever, chills, coffee-grounds emesis, bilious emesis.    Past Medical History  Diagnosis Date  . Asthma    OB History  Gravida Para Term Preterm AB SAB TAB Ectopic Multiple Living  # Outcome Date GA Lbr Len/2nd Weight Sex Delivery Anes PTL Lv  2 Current           1 Term 10/23/13 [redacted]w[redacted]d 05:13 / 00:53 6 lb 1.9 oz (2.775 kg) M Vag-Spont EPI  Y     Past Surgical History  Procedure Laterality Date  . No past surgeries     Social History   Social History  . Marital Status: Single    Spouse Name: N/A  . Number of Children: N/A  . Years of Education: N/A   Occupational History  . Not on file.   Social History Main Topics  . Smoking status: Never Smoker   . Smokeless tobacco: Never Used  . Alcohol Use: No     Comment: Rarely  . Drug Use: No  . Sexual Activity: Yes   Other Topics Concern  . Not on file   Social History Narrative   No current facility-administered medications on file prior to encounter.   Current Outpatient Prescriptions on File Prior to Encounter  Medication Sig Dispense Refill  .  acetaminophen (TYLENOL) 500 MG tablet Take 1,000 mg by mouth every 6 (six) hours as needed for moderate pain.    . Prenatal Vit-Fe Fumarate-FA (PRENATAL MULTIVITAMIN) TABS tablet Take 1 tablet by mouth daily at 12 noon.    . promethazine (PHENERGAN) 25 MG tablet Take 1 tablet (25 mg total) by mouth every 6 (six) hours as needed for nausea or vomiting. 30 tablet 0   No Known Allergies  I have reviewed the past Medical Hx, Surgical Hx, Social Hx, Allergies and Medications.   Review of Systems  Constitutional: Negative for fever, chills and appetite change.  Gastrointestinal: Positive for nausea, vomiting and abdominal pain. Negative for diarrhea, constipation, blood in stool and abdominal distention.  Genitourinary: Negative for vaginal bleeding.    OBJECTIVE Patient Vitals for the past 24 hrs:  BP Temp Temp src Pulse Resp SpO2 Height Weight  01/13/16 2312 118/72 mmHg 99 F (37.2 C) Oral 88 18 100 %  (1.626 m) 153 lb 6.4 oz (69.582 kg)   Constitutional: Well-developed, well-nourished female in no acute distress.  Cardiovascular: normal rate Respiratory: normal rate and effort.  GI: Abd soft, mild mid-abd tenderness btw epigastric area and umbilicus. Pos BS x 4. Neurologic: Alert and oriented x 4.  GU: Deferred  LAB RESULTS Results for orders placed or performed during  the hospital encounter of 01/13/16 (from the past 24 hour(s))  Urinalysis, Routine w reflex microscopic (not [facility])     Status: Abnormal   Collection Time: 01/13/16 11:10 PM  Result Value Ref Range   Color, Urine YELLOW YELLOW   APPearance CLEAR CLEAR   Specific Gravity, Urine 1.015 1.005 - 1.030   pH 6.5 5.0 - 8.0   Glucose, UA NEGATIVE NEGATIVE mg/dL   Hgb urine dipstick TRACE (A) NEGATIVE   Bilirubin Urine NEGATIVE NEGATIVE   Ketones, ur 15 (A) NEGATIVE mg/dL   Protein, ur NEGATIVE NEGATIVE mg/dL   Nitrite NEGATIVE NEGATIVE   Leukocytes, UA TRACE (A) NEGATIVE  Urine microscopic-add on     Status:  Abnormal   Collection Time: 01/13/16 11:10 PM  Result Value Ref Range   Squamous Epithelial / LPF 0-5 (A) NONE SEEN   WBC, UA 0-5 0 - 5 WBC/hpf   RBC / HPF 0-5 0 - 5 RBC/hpf   Bacteria, UA RARE (A) NONE SEEN   Urine-Other MUCOUS PRESENT     IMAGING NA  MAU COURSE Reglan and Pepcid given.  Feeling much better. Tolerating PO's. Abd pain resolved. No vomiting while in MAU.   MDM - N/V of pregnancy that responded well to Reglan. - Upper abd pain->GERD. Responded we// to pepcid.   ASSESSMENT 1. Nausea and vomiting of pregnancy, antepartum   2. Gastroesophageal reflux during pregnancy in first trimester, antepartum     PLAN Discharge home in stable condition. First trimester Precautions Rx Reglan and Pepcid.      Follow-up Information    Follow up with Obstetrician of your choice.   Why:  Start prenatal care      [facility] with [facility] [place] [facility].   Why:  As needed in emergencies   Contact information:   [address] [id number] [place] [id number] [phone]       Medication List    TAKE these medications        acetaminophen 500 MG tablet  Commonly known as:  TYLENOL  Take 1,000 mg by mouth every 6 (six) hours as needed for moderate pain.     famotidine 20 MG tablet  Commonly known as:  PEPCID  Take 1 tablet (20 mg total) by mouth 2 (two) times daily.     metoCLOPramide 10 MG tablet  Commonly known as:  REGLAN  Take 1 tablet (10 mg total) by mouth 4 (four) times daily as needed for nausea or vomiting.     prenatal multivitamin Tabs tablet  Take 1 tablet by mouth daily at 12 noon.     promethazine 25 MG tablet  Commonly known as:  PHENERGAN  Take 1 tablet (25 mg total) by mouth every 6 (six) hours as needed for nausea or vomiting.       [place]wood[place]ia Goertzen, CNM 01/14/2016  2:06 AM

## 2016-01-14 NOTE — Discharge Instructions (Signed)
Morning Sickness Morning sickness is when you feel sick to your stomach (nauseous) during pregnancy. This nauseous feeling may or may not come with vomiting. It often occurs in the morning but can be a problem any time of day. Morning sickness is most common during the first trimester, but it may continue throughout pregnancy. While morning sickness is unpleasant, it is usually harmless unless you develop severe and continual vomiting (hyperemesis gravidarum). This condition requires more intense treatment.  CAUSES  The cause of morning sickness is not completely known but seems to be related to normal hormonal changes that occur in pregnancy. RISK FACTORS You are at greater risk if you:  Experienced nausea or vomiting before your pregnancy.  Had morning sickness during a previous pregnancy.  Are pregnant with more than one baby, such as twins. TREATMENT  Do not use any medicines (prescription, over-the-counter, or herbal) for morning sickness without first talking to your health care provider. Your health care provider may prescribe or recommend:  Vitamin B6 supplements.  Anti-nausea medicines.  The herbal medicine ginger. HOME CARE INSTRUCTIONS   Only take over-the-counter or prescription medicines as directed by your health care provider.  Taking multivitamins before getting pregnant can prevent or decrease the severity of morning sickness in most women.  Eat a piece of dry toast or unsalted crackers before getting out of bed in the morning.  Eat five or six small meals a day.  Eat dry and bland foods (rice, baked potato). Foods high in carbohydrates are often helpful.  Do not drink liquids with your meals. Drink liquids between meals.  Avoid greasy, fatty, and spicy foods.  Get someone to cook for you if the smell of any food causes nausea and vomiting.  If you feel nauseous after taking prenatal vitamins, take the vitamins at night or with a snack.  Snack on protein  foods (nuts, yogurt, cheese) between meals if you are hungry.  Eat unsweetened gelatins for desserts.  Wearing an acupressure wristband (worn for sea sickness) may be helpful.  Acupuncture may be helpful.  Do not smoke.  Get a humidifier to keep the air in your house free of odors.  Get plenty of fresh air. SEEK MEDICAL CARE IF:   Your home remedies are not working, and you need medicine.  You feel dizzy or lightheaded.  You are losing weight. SEEK IMMEDIATE MEDICAL CARE IF:   You have persistent and uncontrolled nausea and vomiting.  You pass out (faint). MAKE SURE YOU:  Understand these instructions.  Will watch your condition.  Will get help right away if you are not doing well or get worse.   This information is not intended to replace advice given to you by your health care provider. Make sure you discuss any questions you have with your health care provider.   Document Released: 08/14/2006 Document Revised: 06/28/2013 Document Reviewed: 12/08/2012 Elsevier Interactive Patient Education 2016 Elsevier Inc.  Heartburn During Pregnancy Heartburn is a burning sensation in the chest caused by stomach acid backing up into the esophagus. Heartburn is common in pregnancy because a certain hormone (progesterone) is released when a woman is pregnant. The progesterone hormone may relax the valve that separates the esophagus from the stomach. This allows acid to go up into the esophagus, causing heartburn. Heartburn may also happen in pregnancy because the enlarging uterus pushes up on the stomach, which pushes more acid into the esophagus. This is especially true in the later stages of pregnancy. Heartburn problems usually go away  after giving birth. CAUSES  Heartburn is caused by stomach acid backing up into the esophagus. During pregnancy, this may result from various things, including:   The progesterone hormone.  Changing hormone levels.  The growing uterus pushing  stomach acid upward.  Large meals.  Certain foods and drinks.  Exercise.  Increased acid production. SIGNS AND SYMPTOMS   Burning pain in the chest or lower throat.  Bitter taste in the mouth.  Coughing. DIAGNOSIS  Your health care provider will typically diagnose heartburn by taking a careful history of your concern. Blood tests may be done to check for a certain type of bacteria that is associated with heartburn. Sometimes, heartburn is diagnosed by prescribing a heartburn medicine to see if the symptoms improve. In some cases, a procedure called an endoscopy may be done. In this procedure, a tube with a light and a camera on the end (endoscope) is used to examine the esophagus and the stomach. TREATMENT  Treatment will vary depending on the severity of your symptoms. Your health care provider may recommend:  Over-the-counter medicines (antacids, acid reducers) for mild heartburn.  Prescription medicines to decrease stomach acid or to protect your stomach lining.  Certain changes in your diet.  Elevating the head of your bed by putting blocks under the legs. This helps prevent stomach acid from backing up into the esophagus when you are lying down. HOME CARE INSTRUCTIONS   Only take over-the-counter or prescription medicines as directed by your health care provider.  Raise the head of your bed by putting blocks under the legs if instructed to do so by your health care provider. Sleeping with more pillows is not effective because it only changes the position of your head.  Do not exercise right after eating.  Avoid eating 2-3 hours before bed. Do not lie down right after eating.  Eat small meals throughout the day instead of three large meals.  Identify foods and beverages that make your symptoms worse and avoid them. Foods you may want to avoid include:  Peppers.  Chocolate.  High-fat foods, including fried foods.  Spicy foods.  Garlic and onions.  Citrus fruits,  including oranges, grapefruit, lemons, and limes.  Food containing tomatoes or tomato products.  Mint.  Carbonated and caffeinated drinks.  Vinegar. SEEK MEDICAL CARE IF:  You have abdominal pain of any kind.  You feel burning in your upper abdomen or chest, especially after eating or lying down.  You have nausea and vomiting.  Your stomach feels upset after you eat. SEEK IMMEDIATE MEDICAL CARE IF:   You have severe chest pain that goes down your arm or into your jaw or neck.  You feel sweaty, dizzy, or light-headed.  You become short of breath.  You vomit blood.  You have difficulty or pain with swallowing.  You have bloody or black, tarry stools.  You have episodes of heartburn more than 3 times a week, for more than 2 weeks. MAKE SURE YOU:  Understand these instructions.  Will watch your condition.  Will get help right away if you are not doing well or get worse.   This information is not intended to replace advice given to you by your health care provider. Make sure you discuss any questions you have with your health care provider.   Document Released: 06/20/2000 Document Revised: 07/14/2014 Document Reviewed: 02/09/2013 Elsevier Interactive Patient Education [employer] Elsevier Inc.

## 2016-02-06 ENCOUNTER — Ambulatory Visit (INDEPENDENT_AMBULATORY_CARE_PROVIDER_SITE_OTHER): Payer: Medicaid Other | Admitting: Advanced Practice Midwife

## 2016-02-06 ENCOUNTER — Encounter: Payer: Self-pay | Admitting: Advanced Practice Midwife

## 2016-02-06 VITALS — BP 112/74 | HR 88 | Wt 150.0 lb

## 2016-02-06 DIAGNOSIS — Z3490 Encounter for supervision of normal pregnancy, unspecified, unspecified trimester: Secondary | ICD-10-CM | POA: Insufficient documentation

## 2016-02-06 DIAGNOSIS — Z3491 Encounter for supervision of normal pregnancy, unspecified, first trimester: Secondary | ICD-10-CM | POA: Diagnosis present

## 2016-02-06 DIAGNOSIS — Z3492 Encounter for supervision of normal pregnancy, unspecified, second trimester: Secondary | ICD-10-CM

## 2016-02-06 DIAGNOSIS — O210 Mild hyperemesis gravidarum: Secondary | ICD-10-CM | POA: Diagnosis not present

## 2016-02-06 DIAGNOSIS — Z3482 Encounter for supervision of other normal pregnancy, second trimester: Secondary | ICD-10-CM

## 2016-02-06 LAB — POCT URINALYSIS DIP (DEVICE)
Glucose, UA: NEGATIVE mg/dL
Ketones, ur: NEGATIVE mg/dL
NITRITE: NEGATIVE
PH: 6.5 (ref 5.0–8.0)
Protein, ur: 30 mg/dL — AB
Specific Gravity, Urine: 1.025 (ref 1.005–1.030)
UROBILINOGEN UA: 1 mg/dL (ref 0.0–1.0)

## 2016-02-06 MED ORDER — ONDANSETRON 4 MG PO TBDP: 4.0000 mg | ORAL_TABLET | Freq: Four times a day (QID) | ORAL | 0 refills | Status: DC | PRN

## 2016-02-06 NOTE — Progress Notes (Signed)
  Subjective:    Deanna Castillo is a G2P1001 [redacted]w[redacted]d being seen today for her first obstetrical visit.  Her obstetrical history is significant for hyperemesis. Patient does intend to breast feed. Pregnancy history fully reviewed.  Patient reports nausea, no bleeding and vomiting.  Still vomiting most of the day. . Lost 15 lbs  Vitals:   02/06/16 0755  BP: 112/74  Pulse: 88  Weight: 150 lb (68 kg)    HISTORY: OB History  Gravida Para Term Preterm AB Living  2 1 1  0 0 1  SAB TAB Ectopic Multiple Live Births  0 0 0 0 1    # Outcome Date GA Lbr Len/2nd Weight Sex Delivery Anes PTL Lv  2 Current           1 Term 10/23/13 [redacted]w[redacted]d 05:13 / 00:53 6 lb 1.9 oz (2.775 kg) M Vag-Spont EPI  LIV     Past Medical History:  Diagnosis Date  . Asthma    Past Surgical History:  Procedure Laterality Date  . NO PAST SURGERIES     Family History  Problem Relation Age of Onset  . Diabetes Maternal Grandmother      Exam    Uterus:     Pelvic Exam:    Perineum: No Hemorrhoids, Normal Perineum   Vulva: Bartholin's, Urethra, Skene's normal   Vagina:  normal discharge   pH:    Cervix: multiparous appearance and no cervical motion tenderness   Adnexa: no mass, fullness, tenderness   Bony Pelvis: gynecoid  System: Breast:  normal appearance, no masses or tenderness   Skin: normal coloration and turgor, no rashes    Neurologic: grossly non-focal   Extremities: normal strength, tone, and muscle mass   HEENT neck supple with midline trachea   Mouth/Teeth mucous membranes moist, pharynx normal without lesions   Neck supple and no masses   Cardiovascular: regular rate and rhythm   Respiratory:  appears well, vitals normal, no respiratory distress, acyanotic, normal RR, ear and throat exam is normal, neck free of mass or lymphadenopathy, chest clear, no wheezing, crepitations, rhonchi, normal symmetric air entry   Abdomen: soft, non-tender; bowel sounds normal; no masses,  no organomegaly   Urinary: urethral meatus normal      Assessment:    Pregnancy: G2P1001 Patient Active Problem List   Diagnosis Date Noted  . Vaginal delivery 10/23/2013  . 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 Anemia--Hgb 10.7 at 28 weeks 10/22/2013  . Asthma, chronic 10/22/2013        Plan:    Routines reviewed Will add zofran.  Still vomiting much of the day.   Initial labs drawn. Prenatal vitamins. Problem list reviewed and updated. Genetic Screening discussed First Screen: ordered.  Ultrasound discussed; fetal survey: requested.  Follow up in 4 weeks. 50% of 30 min visit spent on counseling and coordination of care.     [facility] 02/06/2016

## 2016-02-06 NOTE — Patient Instructions (Signed)
First Trimester of Pregnancy The first trimester of pregnancy is from week 1 until the end of week 12 (months 1 through 3). A week after a sperm fertilizes an egg, the egg will implant on the wall of the uterus. This embryo will begin to develop into a baby. Genes from you and your partner are forming the baby. The female genes determine whether the baby is a boy or a girl. At 6-8 weeks, the eyes and face are formed, and the heartbeat can be seen on ultrasound. At the end of 12 weeks, all the baby's organs are formed.  Now that you are pregnant, you will want to do everything you can to have a healthy baby. Two of the most important things are to get good prenatal care and to follow your health care provider's instructions. Prenatal care is all the medical care you receive before the baby's birth. This care will help prevent, find, and treat any problems during the pregnancy and childbirth. BODY CHANGES Your body goes through many changes during pregnancy. The changes vary from woman to woman.   You may gain or lose a couple of pounds at first.  You may feel sick to your stomach (nauseous) and throw up (vomit). If the vomiting is uncontrollable, call your health care provider.  You may tire easily.  You may develop headaches that can be relieved by medicines approved by your health care provider.  You may urinate more often. Painful urination may mean you have a bladder infection.  You may develop heartburn as a result of your pregnancy.  You may develop constipation because certain hormones are causing the muscles that push waste through your intestines to slow down.  You may develop hemorrhoids or swollen, bulging veins (varicose veins).  Your breasts may begin to grow larger and become tender. Your nipples may stick out more, and the tissue that surrounds them (areola) may become darker.  Your gums may bleed and may be sensitive to brushing and flossing.  Dark spots or blotches (chloasma,  mask of pregnancy) may develop on your face. This will likely fade after the baby is born.  Your menstrual periods will stop.  You may have a loss of appetite.  You may develop cravings for certain kinds of food.  You may have changes in your emotions from day to day, such as being excited to be pregnant or being concerned that something may go wrong with the pregnancy and baby.  You may have more vivid and strange dreams.  You may have changes in your hair. These can include thickening of your hair, rapid growth, and changes in texture. Some women also have hair loss during or after pregnancy, or hair that feels dry or thin. Your hair will most likely return to normal after your baby is born. WHAT TO EXPECT AT YOUR PRENATAL VISITS During a routine prenatal visit:  You will be weighed to make sure you and the baby are growing normally.  Your blood pressure will be taken.  Your abdomen will be measured to track your baby's growth.  The fetal heartbeat will be listened to starting around week 10 or 12 of your pregnancy.  Test results from any previous visits will be discussed. Your health care provider may ask you:  How you are feeling.  If you are feeling the baby move.  If you have had any abnormal symptoms, such as leaking fluid, bleeding, severe headaches, or abdominal cramping.  If you are using any tobacco products,   including cigarettes, chewing tobacco, and electronic cigarettes.  If you have any questions. Other tests that may be performed during your first trimester include:  Blood tests to find your blood type and to check for the presence of any previous infections. They will also be used to check for low iron levels (anemia) and Rh antibodies. Later in the pregnancy, blood tests for diabetes will be done along with other tests if problems develop.  Urine tests to check for infections, diabetes, or protein in the urine.  An ultrasound to confirm the proper growth  and development of the baby.  An amniocentesis to check for possible genetic problems.  Fetal screens for spina bifida and Down syndrome.  You may need other tests to make sure you and the baby are doing well.  HIV (human immunodeficiency virus) testing. Routine prenatal testing includes screening for HIV, unless you choose not to have this test. HOME CARE INSTRUCTIONS  Medicines  Follow your health care provider's instructions regarding medicine use. Specific medicines may be either safe or unsafe to take during pregnancy.  Take your prenatal vitamins as directed.  If you develop constipation, try taking a stool softener if your health care provider approves. Diet  Eat regular, well-balanced meals. Choose a variety of foods, such as meat or vegetable-based protein, fish, milk and low-fat dairy products, vegetables, fruits, and whole grain breads and cereals. Your health care provider will help you determine the amount of weight gain that is right for you.  Avoid raw meat and uncooked cheese. These carry germs that can cause birth defects in the baby.  Eating four or five small meals rather than three large meals a day may help relieve nausea and vomiting. If you start to feel nauseous, eating a few soda crackers can be helpful. Drinking liquids between meals instead of during meals also seems to help nausea and vomiting.  If you develop constipation, eat more high-fiber foods, such as fresh vegetables or fruit and whole grains. Drink enough fluids to keep your urine clear or pale yellow. Activity and Exercise  Exercise only as directed by your health care provider. Exercising will help you:  Control your weight.  Stay in shape.  Be prepared for labor and delivery.  Experiencing pain or cramping in the lower abdomen or low back is a good sign that you should stop exercising. Check with your health care provider before continuing normal exercises.  Try to avoid standing for long  periods of time. Move your legs often if you must stand in one place for a long time.  Avoid heavy lifting.  Wear low-heeled shoes, and practice good posture.  You may continue to have sex unless your health care provider directs you otherwise. Relief of Pain or Discomfort  Wear a good support bra for breast tenderness.   Take warm sitz baths to soothe any pain or discomfort caused by hemorrhoids. Use hemorrhoid cream if your health care provider approves.   Rest with your legs elevated if you have leg cramps or low back pain.  If you develop varicose veins in your legs, wear support hose. Elevate your feet for 15 minutes, 3-4 times a day. Limit salt in your diet. Prenatal Care  Schedule your prenatal visits by the twelfth week of pregnancy. They are usually scheduled monthly at first, then more often in the last 2 months before delivery.  Write down your questions. Take them to your prenatal visits.  Keep all your prenatal visits as directed by your   health care provider. Safety  Wear your seat belt at all times when driving.  Make a list of emergency phone numbers, including numbers for family, friends, the hospital, and police and fire departments. General Tips  Ask your health care provider for a referral to a local prenatal education class. Begin classes no later than at the beginning of month 6 of your pregnancy.  Ask for help if you have counseling or nutritional needs during pregnancy. Your health care provider can offer advice or refer you to specialists for help with various needs.  Do not use hot tubs, steam rooms, or saunas.  Do not douche or use tampons or scented sanitary pads.  Do not cross your legs for long periods of time.  Avoid cat litter boxes and soil used by cats. These carry germs that can cause birth defects in the baby and possibly loss of the fetus by miscarriage or stillbirth.  Avoid all smoking, herbs, alcohol, and medicines not prescribed by  your health care provider. Chemicals in these affect the formation and growth of the baby.  Do not use any tobacco products, including cigarettes, chewing tobacco, and electronic cigarettes. If you need help quitting, ask your health care provider. You may receive counseling support and other resources to help you quit.  Schedule a dentist appointment. At home, brush your teeth with a soft toothbrush and be gentle when you floss. SEEK MEDICAL CARE IF:   You have dizziness.  You have mild pelvic cramps, pelvic pressure, or nagging pain in the abdominal area.  You have persistent nausea, vomiting, or diarrhea.  You have a bad smelling vaginal discharge.  You have pain with urination.  You notice increased swelling in your face, hands, legs, or ankles. SEEK IMMEDIATE MEDICAL CARE IF:   You have a fever.  You are leaking fluid from your vagina.  You have spotting or bleeding from your vagina.  You have severe abdominal cramping or pain.  You have rapid weight gain or loss.  You vomit blood or material that looks like coffee grounds.  You are exposed to German measles and have never had them.  You are exposed to fifth disease or chickenpox.  You develop a severe headache.  You have shortness of breath.  You have any kind of trauma, such as from a fall or a car accident.   This information is not intended to replace advice given to you by your health care provider. Make sure you discuss any questions you have with your health care provider.   Document Released: 06/17/2001 Document Revised: 07/14/2014 Document Reviewed: 05/03/2013 Elsevier Interactive Patient Education 2016 [employer]  

## 2016-02-07 LAB — PRENATAL PROFILE (SOLSTAS)
Antibody Screen: NEGATIVE
BASOS ABS: 0 {cells}/uL (ref 0–200)
Basophils Relative: 0 %
EOS ABS: 80 {cells}/uL (ref 15–500)
Eosinophils Relative: 1 %
HCT: 36.1 % (ref 35.0–45.0)
HEMOGLOBIN: 11.9 g/dL (ref 11.7–15.5)
HEP B S AG: NEGATIVE
HIV 1&2 Ab, 4th Generation: NONREACTIVE
LYMPHS ABS: 2240 {cells}/uL (ref 850–3900)
Lymphocytes Relative: 28 %
MCH: 28.3 pg (ref 27.0–33.0)
MCHC: 33 g/dL (ref 32.0–36.0)
MCV: 85.7 fL (ref 80.0–100.0)
MPV: 10.1 fL (ref 7.5–12.5)
Monocytes Absolute: 480 cells/uL (ref 200–950)
Monocytes Relative: 6 %
NEUTROS ABS: 5200 {cells}/uL (ref 1500–7800)
Neutrophils Relative %: 65 %
PLATELETS: 283 10*3/uL (ref 140–400)
RBC: 4.21 MIL/uL (ref 3.80–5.10)
RDW: 15.3 % — ABNORMAL HIGH (ref 11.0–15.0)
RUBELLA: 3.23 {index} — AB (ref <0.90)
Rh Type: POSITIVE
WBC: 8 10*3/uL (ref 3.8–10.8)

## 2016-02-07 LAB — PAIN MGMT, PROFILE 4 W/CONF, U
AMPHETAMINES: NEGATIVE ng/mL (ref <500)
BARBITURATES: NEGATIVE ng/mL (ref <300)
Benzodiazepines: NEGATIVE ng/mL (ref <100)
CREATININE: 315.3 mg/dL (ref >=20.0)
Cocaine Metabolite: NEGATIVE ng/mL (ref <150)
METHADONE METABOLITE: NEGATIVE ng/mL (ref <100)
OPIATES: NEGATIVE ng/mL (ref <100)
OXYCODONE: NEGATIVE ng/mL (ref <100)
Oxidant: NEGATIVE ug/mL (ref <200)
PH: 6.71 (ref 4.5–9.0)
Phencyclidine: NEGATIVE ng/mL (ref <25)

## 2016-02-07 LAB — GC/CHLAMYDIA PROBE AMP (~~LOC~~) NOT AT ARMC
Chlamydia: NEGATIVE
Neisseria Gonorrhea: NEGATIVE

## 2016-02-07 LAB — CULTURE, OB URINE

## 2016-02-07 LAB — CYTOLOGY - PAP

## 2016-02-08 LAB — HEMOGLOBINOPATHY EVALUATION
HEMATOCRIT: 36.1 % (ref 35.0–45.0)
HEMOGLOBIN: 11.9 g/dL (ref 11.7–15.5)
Hgb A2 Quant: 2.3 % (ref 1.8–3.5)
Hgb A: 96.7 % (ref >96.0)
Hgb F Quant: <1 % (ref <2.0)
MCH: 28.3 pg (ref 27.0–33.0)
MCV: 85.7 fL (ref 80.0–100.0)
RBC: 4.21 MIL/uL (ref 3.80–5.10)
RDW: 15.3 % — AB (ref 11.0–15.0)

## 2016-02-15 ENCOUNTER — Ambulatory Visit (HOSPITAL_COMMUNITY)
Admission: RE | Admit: 2016-02-15 | Discharge: 2016-02-15 | Disposition: A | Discharge status: Home / Self Care | Payer: Medicaid Other | Source: Ambulatory Visit | Attending: Advanced Practice Midwife | Admitting: Advanced Practice Midwife

## 2016-02-15 ENCOUNTER — Other Ambulatory Visit: Payer: Self-pay | Admitting: Advanced Practice Midwife

## 2016-02-15 ENCOUNTER — Encounter (HOSPITAL_COMMUNITY): Payer: Self-pay

## 2016-02-15 DIAGNOSIS — Z3491 Encounter for supervision of normal pregnancy, unspecified, first trimester: Secondary | ICD-10-CM

## 2016-02-15 DIAGNOSIS — Z3682 Encounter for antenatal screening for nuchal translucency: Secondary | ICD-10-CM

## 2016-02-15 DIAGNOSIS — Z36 Encounter for antenatal screening of mother: Secondary | ICD-10-CM | POA: Insufficient documentation

## 2016-02-15 DIAGNOSIS — Z3A13 13 weeks gestation of pregnancy: Secondary | ICD-10-CM

## 2016-02-15 DIAGNOSIS — Z3492 Encounter for supervision of normal pregnancy, unspecified, second trimester: Secondary | ICD-10-CM

## 2016-02-21 ENCOUNTER — Other Ambulatory Visit (HOSPITAL_COMMUNITY): Payer: Self-pay

## 2016-03-03 ENCOUNTER — Telehealth: Payer: Self-pay

## 2016-03-03 NOTE — Telephone Encounter (Signed)
Attempted to contact pt in regards to letting pt know that her FMLA paperwork is completed.  Left note in appt notes to give pt completed FMLA paperwork.

## 2016-03-06 ENCOUNTER — Ambulatory Visit (INDEPENDENT_AMBULATORY_CARE_PROVIDER_SITE_OTHER): Payer: Self-pay | Admitting: Obstetrics and Gynecology

## 2016-03-06 VITALS — BP 120/66 | HR 88 | Wt 154.4 lb

## 2016-03-06 DIAGNOSIS — Z3491 Encounter for supervision of normal pregnancy, unspecified, first trimester: Secondary | ICD-10-CM

## 2016-03-06 DIAGNOSIS — Z3482 Encounter for supervision of other normal pregnancy, second trimester: Secondary | ICD-10-CM

## 2016-03-06 DIAGNOSIS — Z3492 Encounter for supervision of normal pregnancy, unspecified, second trimester: Secondary | ICD-10-CM

## 2016-03-06 LAB — POCT URINALYSIS DIP (DEVICE)
Bilirubin Urine: NEGATIVE
GLUCOSE, UA: NEGATIVE mg/dL
HGB URINE DIPSTICK: NEGATIVE
Ketones, ur: NEGATIVE mg/dL
LEUKOCYTES UA: NEGATIVE
NITRITE: NEGATIVE
Protein, ur: 100 mg/dL — AB
Specific Gravity, Urine: 1.025 (ref 1.005–1.030)
UROBILINOGEN UA: 1 mg/dL (ref 0.0–1.0)
pH: 6.5 (ref 5.0–8.0)

## 2016-03-06 NOTE — Progress Notes (Signed)
Prenatal Visit Note Date: 03/06/2016 Clinic: [facility]  Subjective:  Lum Babeammarie Cypress is a 25 y.o. G2P1001 at 4673w4d being seen today for ongoing prenatal care.  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  Patient reports no complaints.   Contractions: Not present.  .  Movement: Present. Denies leaking of fluid.   The following portions of the patient's history were reviewed and updated as appropriate: allergies, current medications, past family history, past medical history, past social history, past surgical history and problem list. Problem list updated.  Objective:   Vitals:   03/06/16 0806  BP: 120/66  Pulse: 88  Weight: 154 lb 6.4 oz (70 kg)    Fetal Status:     Movement: Present     General:  Alert, oriented and cooperative. Patient is in no acute distress.  Skin: Skin is warm and dry. No rash noted.   Cardiovascular: Normal heart rate noted  Respiratory: Normal respiratory effort, no problems with respiration noted  Abdomen: Soft, gravid, appropriate for gestational age. Pain/Pressure: Absent     Pelvic:  Cervical exam deferred        Extremities: Normal range of motion.  Edema: None  Mental Status: Normal mood and affect. Normal behavior. Normal judgment and thought content.   Urinalysis:      Assessment and Plan:  Pregnancy: G2P1001 at 7373w4d  1. Supervision of normal pregnancy in first trimester Routine care. +4lbs since last visit and just some nausea in AM. Anatomy scan already scheduled. Okay for afp today.  - Alpha fetoprotein, maternal  Preterm labor symptoms and general obstetric precautions including but not limited to vaginal bleeding, contractions, leaking of fluid and fetal movement were reviewed in detail with the patient. Please refer to After Visit Summary for other  counseling recommendations.  Return in about 4 weeks (around 04/03/2016).   Stanley Bingharlie Allana Shrestha, MD

## 2016-03-07 LAB — ALPHA FETOPROTEIN, MATERNAL
AFP: 27.8 ng/mL
CURR GEST AGE: 16.6 wk
MoM for AFP: 0.68
Open Spina bifida: NEGATIVE
Osb Risk: <1:54600 {titer}

## 2016-03-24 ENCOUNTER — Ambulatory Visit (HOSPITAL_COMMUNITY)
Admission: RE | Admit: 2016-03-24 | Discharge: 2016-03-24 | Disposition: A | Discharge status: Home / Self Care | Payer: Medicaid Other | Source: Ambulatory Visit | Attending: Advanced Practice Midwife | Admitting: Advanced Practice Midwife

## 2016-03-24 DIAGNOSIS — Z3492 Encounter for supervision of normal pregnancy, unspecified, second trimester: Secondary | ICD-10-CM | POA: Insufficient documentation

## 2016-03-24 DIAGNOSIS — Z3A19 19 weeks gestation of pregnancy: Secondary | ICD-10-CM | POA: Diagnosis not present

## 2016-03-24 DIAGNOSIS — Z3491 Encounter for supervision of normal pregnancy, unspecified, first trimester: Secondary | ICD-10-CM

## 2016-04-03 ENCOUNTER — Encounter: Payer: Medicaid Other | Admitting: Obstetrics and Gynecology

## 2016-04-03 ENCOUNTER — Encounter: Payer: Self-pay | Admitting: Obstetrics and Gynecology

## 2016-04-03 NOTE — Progress Notes (Signed)
Patient did not keep OB appointment for 04/03/2016.  Lesia Monica, Jr MD Attending [facility]   

## 2016-04-27 ENCOUNTER — Inpatient Hospital Stay (HOSPITAL_COMMUNITY)
Admission: AD | Admit: 2016-04-27 | Discharge: 2016-04-27 | Disposition: A | Discharge status: Home / Self Care | Payer: Medicaid Other | Source: Ambulatory Visit | Attending: Obstetrics and Gynecology | Admitting: Obstetrics and Gynecology

## 2016-04-27 ENCOUNTER — Encounter (HOSPITAL_COMMUNITY): Payer: Self-pay | Admitting: *Deleted

## 2016-04-27 DIAGNOSIS — O219 Vomiting of pregnancy, unspecified: Secondary | ICD-10-CM

## 2016-04-27 DIAGNOSIS — O212 Late vomiting of pregnancy: Secondary | ICD-10-CM | POA: Diagnosis present

## 2016-04-27 DIAGNOSIS — Z3A24 24 weeks gestation of pregnancy: Secondary | ICD-10-CM | POA: Diagnosis not present

## 2016-04-27 LAB — URINALYSIS, ROUTINE W REFLEX MICROSCOPIC
Bilirubin Urine: NEGATIVE
Glucose, UA: NEGATIVE mg/dL
HGB URINE DIPSTICK: NEGATIVE
Ketones, ur: NEGATIVE mg/dL
LEUKOCYTES UA: NEGATIVE
NITRITE: NEGATIVE
PROTEIN: NEGATIVE mg/dL
SPECIFIC GRAVITY, URINE: 1.02 (ref 1.005–1.030)
pH: 7 (ref 5.0–8.0)

## 2016-04-27 MED ORDER — METOCLOPRAMIDE HCL 10 MG PO TABS: 10.0000 mg | ORAL_TABLET | Freq: Three times a day (TID) | ORAL | 0 refills | Status: DC

## 2016-04-27 NOTE — MAU Note (Signed)
Has had ? Stomach virus for the past week - vomiting & diarrhea, not as bad now.  Has lower abd pain only with vomiting.  One diarrhea stool today.  Denies fever.

## 2016-04-27 NOTE — Discharge Instructions (Signed)

## 2016-04-27 NOTE — MAU Provider Note (Signed)
History    [name] is a 25 yo G2P1001 @ 24.0 wks who presents with N/V that has been ongoing since the start of pregn. Pt states she feels better now and wanted to make sure baby was ok.   CSN: [id number]  Arrival date & time 04/27/16  1549   None     Chief Complaint  Patient presents with  . Emesis During Pregnancy  . Diarrhea    HPI  Past Medical History:  Diagnosis Date  . Asthma     Past Surgical History:  Procedure Laterality Date  . NO PAST SURGERIES      Family History  Problem Relation Age of Onset  . Diabetes Maternal Grandmother     Social History  Substance Use Topics  . Smoking status: Never Smoker  . Smokeless tobacco: Never Used  . Alcohol use No     Comment: Rarely    OB History    Gravida Para Term Preterm AB Living   2 1 1  0 0 1   SAB TAB Ectopic Multiple Live Births   0 0 0 0 1      Review of Systems  Constitutional: Negative.   HENT: Negative.   Eyes: Negative.   Respiratory: Negative.   Cardiovascular: Negative.   Gastrointestinal: Positive for nausea.  Endocrine: Negative.   Genitourinary: Negative.   Musculoskeletal: Negative.   Skin: Negative.   Allergic/Immunologic: Negative.   Neurological: Negative.   Hematological: Negative.   Psychiatric/Behavioral: Negative.     Allergies  Review of patient's allergies indicates no known allergies.  Home Medications    BP 103/58 (BP Location: Right Arm)   Pulse 102   Temp 98.9 F (37.2 C) (Oral)   Resp 18   Ht 5\' 4"  (1.626 m)   Wt 156 lb (70.8 kg)   LMP 10/20/2015 (Approximate)   BMI 26.78 kg/m   Physical Exam  Constitutional: She is oriented to person, place, and time. She appears well-developed and well-nourished.  HENT:  Head: Normocephalic and atraumatic.  Eyes: Pupils are equal, round, and reactive to light.  Neck: Normal range of motion. Neck supple.  Cardiovascular: Normal rate and regular rhythm.   Pulmonary/Chest: Effort normal and breath sounds normal.   Abdominal: Soft. Bowel sounds are normal.  Musculoskeletal: Normal range of motion.  Neurological: She is alert and oriented to person, place, and time.  Skin: Skin is warm and dry.  Psychiatric: She has a normal mood and affect. Her behavior is normal. Judgment and thought content normal.    MAU Course  Procedures (including critical care time) UA-neg Labs Reviewed  URINALYSIS, ROUTINE W REFLEX MICROSCOPIC (NOT [facility])   No results found.   No diagnosis found.    MDM  1. IUP @ 24.0 wks  2. Nausea/ Vomiting in pregn  Plan: Discussed treatment options for N/V, pt states she is nauseated when she hasn't eaten in a while or early am, discussed eating small frequent meals every 2-3 hours, adequate H2O intake, and follow up in clinic- pt states she hasn't been since 2 weeks; Safe meds to take while pregn list given to pt; discharge home

## 2016-06-04 ENCOUNTER — Ambulatory Visit (INDEPENDENT_AMBULATORY_CARE_PROVIDER_SITE_OTHER): Payer: Medicaid Other | Admitting: Advanced Practice Midwife

## 2016-06-04 DIAGNOSIS — Z3482 Encounter for supervision of other normal pregnancy, second trimester: Secondary | ICD-10-CM | POA: Diagnosis present

## 2016-06-04 DIAGNOSIS — Z23 Encounter for immunization: Secondary | ICD-10-CM | POA: Diagnosis not present

## 2016-06-04 LAB — CBC
HEMATOCRIT: 31.7 % — AB (ref 35.0–45.0)
HEMOGLOBIN: 10.3 g/dL — AB (ref 11.7–15.5)
MCH: 28.1 pg (ref 27.0–33.0)
MCHC: 32.5 g/dL (ref 32.0–36.0)
MCV: 86.6 fL (ref 80.0–100.0)
MPV: 11.1 fL (ref 7.5–12.5)
Platelets: 235 10*3/uL (ref 140–400)
RBC: 3.66 MIL/uL — AB (ref 3.80–5.10)
RDW: 14.7 % (ref 11.0–15.0)
WBC: 8.7 10*3/uL (ref 3.8–10.8)

## 2016-06-04 MED ORDER — DOXYLAMINE-PYRIDOXINE 10-10 MG PO TBEC: 1.0000 | DELAYED_RELEASE_TABLET | Freq: Every evening | ORAL | 1 refills | Status: DC | PRN

## 2016-06-04 NOTE — Progress Notes (Signed)
   PRENATAL VISIT NOTE  Subjective:  Lum Babeammarie Zurn is a 25 y.o. G2P1001 at 5149w3d being seen today for ongoing prenatal care.  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  Patient reports no complaints.  Contractions: Not present. Vag. Bleeding: None.  Movement: Present. Denies leaking of fluid. RN gave her regular one hour Glucola today because she wasnot fasting.  Has had trouble getting to visits due to work and transportation. States transportation is better now and will be coming more often.   The following portions of the patient's history were reviewed and updated as appropriate: allergies, current medications, past family history, past medical history, past social history, past surgical history and problem list. Problem list updated.  Objective:   Vitals:   06/04/16 0823  BP: 113/76  Pulse: 90  Weight: 158 lb (71.7 kg)    Fetal Status: Fetal Heart Rate (bpm): 160   Movement: Present     General:  Alert, oriented and cooperative. Patient is in no acute distress.  Skin: Skin is warm and dry. No rash noted.   Cardiovascular: Normal heart rate noted  Respiratory: Normal respiratory effort, no problems with respiration noted  Abdomen: Soft, gravid, appropriate for gestational age. Pain/Pressure: Absent     Pelvic:  Cervical exam deferred        Extremities: Normal range of motion.  Edema: None  Mental Status: Normal mood and affect. Normal behavior. Normal judgment and thought content.   Assessment and Plan:  Pregnancy: G2P1001 at 4149w3d  1. Encounter for supervision of other normal pregnancy in second trimester      Doing well. 28 wk labs today.  PTL precautions reviewed - CBC - RPR - HIV antibody (with reflex) - Glucose Tolerance, 1 HR (50g) w/o Fasting - Tdap vaccine greater than or equal to 7yo IM  Preterm  labor symptoms and general obstetric precautions including but not limited to vaginal bleeding, contractions, leaking of fluid and fetal movement were reviewed in detail with the patient. Please refer to After Visit Summary for other counseling recommendations.  Return in about 2 weeks (around 06/18/2016) for [facility].   Aviva SignsMarie L Enrica Corliss, CNM

## 2016-06-04 NOTE — Patient Instructions (Signed)

## 2016-06-04 NOTE — Progress Notes (Signed)
1 hr @ 927

## 2016-06-05 LAB — GLUCOSE TOLERANCE, 1 HOUR (50G) W/O FASTING: GLUCOSE, 1 HR, GESTATIONAL: 124 mg/dL (ref <140)

## 2016-06-05 LAB — HIV ANTIBODY (ROUTINE TESTING W REFLEX): HIV: NONREACTIVE

## 2016-06-05 LAB — RPR

## 2016-06-18 ENCOUNTER — Ambulatory Visit (INDEPENDENT_AMBULATORY_CARE_PROVIDER_SITE_OTHER): Payer: Self-pay | Admitting: Obstetrics and Gynecology

## 2016-06-18 VITALS — BP 112/75 | HR 101 | Wt 154.6 lb

## 2016-06-18 DIAGNOSIS — Z3482 Encounter for supervision of other normal pregnancy, second trimester: Secondary | ICD-10-CM

## 2016-06-18 NOTE — Progress Notes (Signed)
Subjective:  Deanna Castillo is a 25 y.o. G2P1001 at [redacted]w[redacted]d being seen today for ongoing prenatal care.  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  Patient reports no complaints.  Contractions: Not present. Vag. Bleeding: None.  Movement: Present. Denies leaking of fluid.   The following portions of the patient's history were reviewed and updated as appropriate: allergies, current medications, past family history, past medical history, past social history, past surgical history and problem list. Problem list updated.  Objective:   Vitals:   06/18/16 1021  BP: 112/75  Pulse: (!) 101  Weight: 154 lb 9.6 oz (70.1 kg)    Fetal Status: Fetal Heart Rate (bpm): 140   Movement: Present     General:  Alert, oriented and cooperative. Patient is in no acute distress.  Skin: Skin is warm and dry. No rash noted.   Cardiovascular: Normal heart rate noted  Respiratory: Normal respiratory effort, no problems with respiration noted  Abdomen: Soft, gravid, appropriate for gestational age. Pain/Pressure: Present     Pelvic:  Cervical exam deferred        Extremities: Normal range of motion.  Edema: None  Mental Status: Normal mood and affect. Normal behavior. Normal judgment and thought content.   Urinalysis:      Assessment and Plan:  Pregnancy: G2P1001 at [redacted]w[redacted]d  1. Encounter for supervision of other normal pregnancy in second trimester   Preterm labor symptoms and general obstetric precautions including but not limited to vaginal bleeding, contractions, leaking of fluid and fetal movement were reviewed in detail with the patient. Please refer to After Visit Summary for other counseling recommendations.  Return in about 2 weeks (around 07/02/2016) for OB visit.   Hermina StaggersMichael L Lisle Skillman, MD

## 2016-07-07 NOTE — L&D Delivery Note (Signed)
Operative Delivery Note At 11:39 PM a viable female was delivered via Vaginal, Vacuum Investment banker, operational(Extractor).  Presentation: vertex; Position: Right,, Occiput,, Anterior; Station: +2.  Verbal consent: obtained from patient.  Risks and benefits discussed in detail.  Risks include, but are not limited to the risks of anesthesia, bleeding, infection, damage to maternal tissues, fetal cephalhematoma.  There is also the risk of inability to effect vaginal delivery of the head, or shoulder dystocia that cannot be resolved by established maneuvers, leading to the need for emergency cesarean section.  Placenta status: delivered intact with gentle traction.   Cord: 3 vessel with the following complications: nuccal times 2.  Cord pH: 7.14  Patient had significant variable decelerations. With pushing patient decelerated to 60's. Kiwi placed. 1 pop off. With second pull infant rotated from OP position and delivered.  Anesthesia:  epidural Instruments: Kiwi Vaccuum Episiotomy: None Lacerations: None Est. Blood Loss (mL): 200  Mom to postpartum.  Baby to Couplet care / Skin to Skin.  Ernestina Pennaicholas Schenk 08/04/2016, 11:51 PM

## 2016-07-08 ENCOUNTER — Ambulatory Visit (INDEPENDENT_AMBULATORY_CARE_PROVIDER_SITE_OTHER): Payer: Self-pay | Admitting: Obstetrics & Gynecology

## 2016-07-08 VITALS — BP 119/81 | HR 93 | Wt 159.4 lb

## 2016-07-08 DIAGNOSIS — Z3483 Encounter for supervision of other normal pregnancy, third trimester: Secondary | ICD-10-CM

## 2016-07-08 DIAGNOSIS — Z3480 Encounter for supervision of other normal pregnancy, unspecified trimester: Secondary | ICD-10-CM

## 2016-07-08 MED ORDER — PANTOPRAZOLE SODIUM 40 MG PO TBEC: 40.0000 mg | DELAYED_RELEASE_TABLET | Freq: Every day | ORAL | 2 refills | Status: DC

## 2016-07-08 NOTE — Progress Notes (Signed)
   PRENATAL VISIT NOTE  Subjective:  Deanna Castillo is a 26 y.o. G2P1001 at [redacted]w[redacted]d being seen today for ongoing prenatal care.  She is currently monitored for the following issues for this low-risk pregnancy and has Asthma, chronic and Supervision of normal pregnancy, antepartum on her problem list.  Patient reports nausea and vomiting.  Contractions: Irregular. Vag. Bleeding: None.  Movement: Present. Denies leaking of fluid.   The following portions of the patient's history were reviewed and updated as appropriate: allergies, current medications, past family history, past medical history, past social history, past surgical history and problem list. Problem list updated.  Objective:   Vitals:   07/08/16 1519  BP: 119/81  Pulse: 93  Weight: 159 lb 6.4 oz (72.3 kg)    Fetal Status: Fetal Heart Rate (bpm): 148   Movement: Present     General:  Alert, oriented and cooperative. Patient is in no acute distress.  Skin: Skin is warm and dry. No rash noted.   Cardiovascular: Normal heart rate noted  Respiratory: Normal respiratory effort, no problems with respiration noted  Abdomen: Soft, gravid, appropriate for gestational age. Pain/Pressure: Present     Pelvic:  Cervical exam deferred        Extremities: Normal range of motion.  Edema: None  Mental Status: Normal mood and affect. Normal behavior. Normal judgment and thought content.   Assessment and Plan:  Pregnancy: G2P1001 at [redacted]w[redacted]d  1. Supervision of other normal pregnancy, antepartum Possible reflux sx - pantoprazole (PROTONIX) 40 MG tablet; Take 1 tablet (40 mg total) by mouth daily.  Dispense: 30 tablet; Refill: 2  Preterm labor symptoms and general obstetric precautions including but not limited to vaginal bleeding, contractions, leaking of fluid and fetal movement were reviewed in detail with the patient. Please refer to After Visit Summary for other counseling recommendations.  Return in about 2 weeks (around  07/22/2016).   Adam PhenixJames G Richey Doolittle, MD

## 2016-07-24 ENCOUNTER — Encounter: Payer: Self-pay | Admitting: Family

## 2016-07-31 ENCOUNTER — Ambulatory Visit (INDEPENDENT_AMBULATORY_CARE_PROVIDER_SITE_OTHER): Payer: Medicaid Other | Admitting: Advanced Practice Midwife

## 2016-07-31 ENCOUNTER — Other Ambulatory Visit (HOSPITAL_COMMUNITY)
Admission: RE | Admit: 2016-07-31 | Discharge: 2016-07-31 | Disposition: A | Discharge status: Home / Self Care | Payer: Medicaid Other | Source: Ambulatory Visit | Attending: Advanced Practice Midwife | Admitting: Advanced Practice Midwife

## 2016-07-31 VITALS — BP 114/81 | HR 90 | Wt 162.3 lb

## 2016-07-31 DIAGNOSIS — Z113 Encounter for screening for infections with a predominantly sexual mode of transmission: Secondary | ICD-10-CM

## 2016-07-31 DIAGNOSIS — O2613 Low weight gain in pregnancy, third trimester: Secondary | ICD-10-CM | POA: Insufficient documentation

## 2016-07-31 DIAGNOSIS — Z3483 Encounter for supervision of other normal pregnancy, third trimester: Secondary | ICD-10-CM | POA: Diagnosis present

## 2016-07-31 DIAGNOSIS — Z3480 Encounter for supervision of other normal pregnancy, unspecified trimester: Secondary | ICD-10-CM

## 2016-07-31 HISTORY — DX: Low weight gain in pregnancy, third trimester: O26.13

## 2016-07-31 LAB — OB RESULTS CONSOLE GBS: GBS: NEGATIVE

## 2016-07-31 NOTE — Patient Instructions (Signed)
Braxton Hicks Contractions °Contractions of the uterus can occur throughout pregnancy. Contractions are not always a sign that you are in labor.  °WHAT ARE BRAXTON HICKS CONTRACTIONS?  °Contractions that occur before labor are called Braxton Hicks contractions, or false labor. Toward the end of pregnancy (32-34 weeks), these contractions can develop more often and may become more forceful. This is not true labor because these contractions do not result in opening (dilatation) and thinning of the cervix. They are sometimes difficult to tell apart from true labor because these contractions can be forceful and people have different pain tolerances. You should not feel embarrassed if you go to the hospital with false labor. Sometimes, the only way to tell if you are in true labor is for your health care provider to look for changes in the cervix. °If there are no prenatal problems or other health problems associated with the pregnancy, it is completely safe to be sent home with false labor and await the onset of true labor. °HOW CAN YOU TELL THE DIFFERENCE BETWEEN TRUE AND FALSE LABOR? °False Labor  °· The contractions of false labor are usually shorter and not as hard as those of true labor.   °· The contractions are usually irregular.   °· The contractions are often felt in the front of the lower abdomen and in the groin.   °· The contractions may go away when you walk around or change positions while lying down.   °· The contractions get weaker and are shorter lasting as time goes on.   °· The contractions do not usually become progressively stronger, regular, and closer together as with true labor.   °True Labor  °· Contractions in true labor last 30-70 seconds, become very regular, usually become more intense, and increase in frequency.   °· The contractions do not go away with walking.   °· The discomfort is usually felt in the top of the uterus and spreads to the lower abdomen and low back.   °· True labor can be  determined by your health care provider with an exam. This will show that the cervix is dilating and getting thinner.   °WHAT TO REMEMBER °· Keep up with your usual exercises and follow other instructions given by your health care provider.   °· Take medicines as directed by your health care provider.   °· Keep your regular prenatal appointments.   °· Eat and drink lightly if you think you are going into labor.   °· If Braxton Hicks contractions are making you uncomfortable:   °¨ Change your position from lying down or resting to walking, or from walking to resting.   °¨ Sit and rest in a tub of warm water.   °¨ Drink 2-3 glasses of water. Dehydration may cause these contractions.   °¨ Do slow and deep breathing several times an hour.   °WHEN SHOULD I SEEK IMMEDIATE MEDICAL CARE? °Seek immediate medical care if: °· Your contractions become stronger, more regular, and closer together.   °· You have fluid leaking or gushing from your vagina.   °· You have a fever.   °· You pass blood-tinged mucus.   °· You have vaginal bleeding.   °· You have continuous abdominal pain.   °· You have low back pain that you never had before.   °· You feel your baby's head pushing down and causing pelvic pressure.   °· Your baby is not moving as much as it used to.   °This information is not intended to replace advice given to you by your health care provider. Make sure you discuss any questions you have with your health care   provider. °Document Released: 06/23/2005 Document Revised: 10/15/2015 Document Reviewed: 04/04/2013 °Elsevier Interactive Patient Education © 2017 [employer] Inc. ° °

## 2016-07-31 NOTE — Progress Notes (Signed)
   PRENATAL VISIT NOTE  Subjective:  Deanna Castillo is a 26 y.o. G2P1001 at 2663w4d being seen today for ongoing prenatal care.  She is currently monitored for the following issues for this low-risk pregnancy and has Asthma, chronic and Supervision of normal pregnancy, antepartum on her problem list.  Patient reports increased braxton Hicks and small amount of bleeding x 1 this morning. .  Contractions: Irregular. Vag. Bleeding: None.  Movement: Present. Denies leaking of fluid.   The following portions of the patient's history were reviewed and updated as appropriate: allergies, current medications, past family history, past medical history, past social history, past surgical history and problem list. Problem list updated.  Objective:   Vitals:   07/31/16 0954  BP: 114/81  Pulse: 90  Weight: 162 lb 4.8 oz (73.6 kg)    Fetal Status: Fetal Heart Rate (bpm): 141 Fundal Height: 37 cm Movement: Present  Presentation: Vertex  General:  Alert, oriented and cooperative. Patient is in no acute distress.  Skin: Skin is warm and dry. No rash noted.   Cardiovascular: Normal heart rate noted  Respiratory: Normal respiratory effort, no problems with respiration noted  Abdomen: Soft, gravid, appropriate for gestational age. Pain/Pressure: Absent     Pelvic:  Cervical exam performed Dilation: 2 Effacement (%): 30 Station: -3  Spec exam: Moderate mucoid discharge w/ scant blood show. No active bleeding of frank blood  Extremities: Normal range of motion.  Edema: None  Mental Status: Normal mood and affect. Normal behavior. Normal judgment and thought content.   Assessment and Plan:  Pregnancy: G2P1001 at 6563w4d  1. Supervision of other normal pregnancy, antepartum  - Culture, beta strep (group b only) - GC/Chlamydia probe amp ([place]  2. Normal Bloody show.   Term labor symptoms and general obstetric precautions including but not limited to vaginal bleeding, contractions,  leaking of fluid and fetal movement were reviewed in detail with the patient. Please refer to After Visit Summary for other counseling recommendations.  F/U 12 week   Dorathy KinsmanVirginia Cassara, [place]

## 2016-08-01 LAB — GC/CHLAMYDIA PROBE AMP (~~LOC~~) NOT AT ARMC
CHLAMYDIA, DNA PROBE: NEGATIVE
Neisseria Gonorrhea: NEGATIVE

## 2016-08-02 LAB — CULTURE, BETA STREP (GROUP B ONLY)

## 2016-08-04 ENCOUNTER — Inpatient Hospital Stay (HOSPITAL_COMMUNITY)
Admission: AD | Admit: 2016-08-04 | Discharge: 2016-08-06 | DRG: 775 | Disposition: A | Discharge status: Home / Self Care | Payer: Medicaid Other | Source: Ambulatory Visit | Attending: Family Medicine | Admitting: Family Medicine

## 2016-08-04 ENCOUNTER — Inpatient Hospital Stay (HOSPITAL_COMMUNITY): Payer: Medicaid Other | Admitting: Anesthesiology

## 2016-08-04 ENCOUNTER — Encounter (HOSPITAL_COMMUNITY): Payer: Self-pay

## 2016-08-04 DIAGNOSIS — J45909 Unspecified asthma, uncomplicated: Secondary | ICD-10-CM | POA: Diagnosis present

## 2016-08-04 DIAGNOSIS — Z3A38 38 weeks gestation of pregnancy: Secondary | ICD-10-CM | POA: Diagnosis not present

## 2016-08-04 DIAGNOSIS — O9952 Diseases of the respiratory system complicating childbirth: Secondary | ICD-10-CM | POA: Diagnosis present

## 2016-08-04 DIAGNOSIS — Z833 Family history of diabetes mellitus: Secondary | ICD-10-CM

## 2016-08-04 DIAGNOSIS — Z3493 Encounter for supervision of normal pregnancy, unspecified, third trimester: Secondary | ICD-10-CM | POA: Diagnosis present

## 2016-08-04 DIAGNOSIS — O2613 Low weight gain in pregnancy, third trimester: Secondary | ICD-10-CM

## 2016-08-04 DIAGNOSIS — Z3480 Encounter for supervision of other normal pregnancy, unspecified trimester: Secondary | ICD-10-CM

## 2016-08-04 LAB — CBC
HEMATOCRIT: 33.4 % — AB (ref 36.0–46.0)
HEMOGLOBIN: 11.2 g/dL — AB (ref 12.0–15.0)
MCH: 27.1 pg (ref 26.0–34.0)
MCHC: 33.5 g/dL (ref 30.0–36.0)
MCV: 80.9 fL (ref 78.0–100.0)
Platelets: 264 10*3/uL (ref 150–400)
RBC: 4.13 MIL/uL (ref 3.87–5.11)
RDW: 15 % (ref 11.5–15.5)
WBC: 10.2 10*3/uL (ref 4.0–10.5)

## 2016-08-04 LAB — TYPE AND SCREEN
ABO/RH(D): O POS
ANTIBODY SCREEN: NEGATIVE

## 2016-08-04 LAB — ABO/RH: ABO/RH(D): O POS

## 2016-08-04 MED ORDER — LACTATED RINGERS IV SOLN
INTRAVENOUS | Status: DC
  Administered 2016-08-04 (×3): via INTRAVENOUS

## 2016-08-04 MED ORDER — OXYTOCIN 40 UNITS IN LACTATED RINGERS INFUSION - SIMPLE MED
2.5000 [IU]/h | INTRAVENOUS | Status: DC
  Filled 2016-08-04: qty 1000

## 2016-08-04 MED ORDER — FENTANYL 2.5 MCG/ML BUPIVACAINE 1/10 % EPIDURAL INFUSION (WH - ANES): 14.0000 mL/h | INTRAMUSCULAR | Status: DC | PRN

## 2016-08-04 MED ORDER — FLEET ENEMA 7-19 GM/118ML RE ENEM: 1.0000 | ENEMA | RECTAL | Status: DC | PRN

## 2016-08-04 MED ORDER — LIDOCAINE HCL (PF) 1 % IJ SOLN
30.0000 mL | INTRAMUSCULAR | Status: DC | PRN
  Filled 2016-08-04: qty 30

## 2016-08-04 MED ORDER — EPHEDRINE 5 MG/ML INJ
10.0000 mg | INTRAVENOUS | Status: DC | PRN
  Filled 2016-08-04 (×2): qty 4

## 2016-08-04 MED ORDER — OXYTOCIN BOLUS FROM INFUSION
500.0000 mL | Freq: Once | INTRAVENOUS | Status: AC
  Administered 2016-08-04: 500 mL via INTRAVENOUS

## 2016-08-04 MED ORDER — OXYCODONE-ACETAMINOPHEN 5-325 MG PO TABS: 1.0000 | ORAL_TABLET | ORAL | Status: DC | PRN

## 2016-08-04 MED ORDER — LIDOCAINE HCL (PF) 1 % IJ SOLN
INTRAMUSCULAR | Status: DC | PRN
  Administered 2016-08-04 (×2): 4 mL

## 2016-08-04 MED ORDER — SOD CITRATE-CITRIC ACID 500-334 MG/5ML PO SOLN: 30.0000 mL | ORAL | Status: DC | PRN

## 2016-08-04 MED ORDER — DIPHENHYDRAMINE HCL 50 MG/ML IJ SOLN: 12.5000 mg | INTRAMUSCULAR | Status: DC | PRN

## 2016-08-04 MED ORDER — ONDANSETRON HCL 4 MG/2ML IJ SOLN
4.0000 mg | Freq: Four times a day (QID) | INTRAMUSCULAR | Status: DC | PRN
  Administered 2016-08-04: 4 mg via INTRAVENOUS
  Filled 2016-08-04: qty 2

## 2016-08-04 MED ORDER — FENTANYL 2.5 MCG/ML BUPIVACAINE 1/10 % EPIDURAL INFUSION (WH - ANES)
14.0000 mL/h | INTRAMUSCULAR | Status: DC | PRN
  Administered 2016-08-04 (×2): 14 mL/h via EPIDURAL
  Filled 2016-08-04 (×2): qty 100

## 2016-08-04 MED ORDER — OXYCODONE-ACETAMINOPHEN 5-325 MG PO TABS: 2.0000 | ORAL_TABLET | ORAL | Status: DC | PRN

## 2016-08-04 MED ORDER — EPHEDRINE 5 MG/ML INJ
10.0000 mg | INTRAVENOUS | Status: DC | PRN
  Filled 2016-08-04: qty 4

## 2016-08-04 MED ORDER — ACETAMINOPHEN 325 MG PO TABS: 650.0000 mg | ORAL_TABLET | ORAL | Status: DC | PRN

## 2016-08-04 MED ORDER — LACTATED RINGERS IV SOLN
500.0000 mL | INTRAVENOUS | Status: DC | PRN
  Administered 2016-08-04: 500 mL via INTRAVENOUS

## 2016-08-04 MED ORDER — LACTATED RINGERS IV SOLN
500.0000 mL | Freq: Once | INTRAVENOUS | Status: AC
  Administered 2016-08-04: 500 mL via INTRAVENOUS

## 2016-08-04 MED ORDER — LACTATED RINGERS IV SOLN
INTRAVENOUS | Status: DC
  Administered 2016-08-04: 21:00:00 via INTRAUTERINE

## 2016-08-04 MED ORDER — PHENYLEPHRINE 40 MCG/ML (10ML) SYRINGE FOR IV PUSH (FOR BLOOD PRESSURE SUPPORT)
80.0000 ug | PREFILLED_SYRINGE | INTRAVENOUS | Status: DC | PRN
  Administered 2016-08-04 (×2): 80 ug via INTRAVENOUS
  Filled 2016-08-04: qty 5

## 2016-08-04 MED ORDER — PHENYLEPHRINE 40 MCG/ML (10ML) SYRINGE FOR IV PUSH (FOR BLOOD PRESSURE SUPPORT)
80.0000 ug | PREFILLED_SYRINGE | INTRAVENOUS | Status: DC | PRN
  Filled 2016-08-04: qty 10
  Filled 2016-08-04: qty 5

## 2016-08-04 NOTE — Anesthesia Pain Management Evaluation Note (Signed)
  CRNA Pain Management Visit Note  Patient: Deanna Castillo, 26 y.o., female  "Hello I am a member of the anesthesia team at [facility]. We have an anesthesia team available at all times to provide care throughout the hospital, including epidural management and anesthesia for C-section. I don't know your plan for the delivery whether it a natural birth, water birth, IV sedation, nitrous supplementation, doula or epidural, but we want to meet your pain goals."   1.Was your pain managed to your expectations on prior hospitalizations?   Yes   2.What is your expectation for pain management during this hospitalization?     Epidural  3.How can we help you reach that goal? Epidural ASAP  Record the patient's initial score and the patient's pain goal.   Pain: 10  Pain Goal: 2 [facility] wants you to be able to say your pain was always managed very well.  Deanna Castillo 08/04/2016

## 2016-08-04 NOTE — H&P (Signed)
LABOR AND DELIVERY ADMISSION HISTORY AND PHYSICAL NOTE  Deanna Castillo is a 26 y.o. female G2P1001 with IUP at 4979w1d by LMP presenting for labor.  Patient reports increasing contractions that started around 0900 this AM and bloody show.  Denies any CP, SOB, HA, changes in vision, RUQ pain or LE edema.  She reports positive fetal movement. She denies leakage of fluid or vaginal bleeding.  Prenatal History/Complications:  Past Medical History: Past Medical History:  Diagnosis Date  . Asthma     Past Surgical History: Past Surgical History:  Procedure Laterality Date  . NO PAST SURGERIES      Obstetrical History: OB History    Gravida Para Term Preterm AB Living   2 1 1  0 0 1   SAB TAB Ectopic Multiple Live Births   0 0 0 0 1      Social History: Social History   Social History  . Marital status: Single    Spouse name: N/A  . Number of children: N/A  . Years of education: N/A   Social History Main Topics  . Smoking status: Never Smoker  . Smokeless tobacco: Never Used  . Alcohol use No     Comment: Rarely  . Drug use: No  . Sexual activity: Yes   Other Topics Concern  . None   Social History Narrative  . None    Family History: Family History  Problem Relation Age of Onset  . Diabetes Maternal Grandmother     Allergies: No Known Allergies  Prescriptions Prior to Admission  Medication Sig Dispense Refill Last Dose  . Doxylamine-Pyridoxine (DICLEGIS) 10-10 MG TBEC Take 1 tablet by mouth at bedtime as needed. (Patient taking differently: Take 1 tablet by mouth at bedtime as needed (for nausea). ) 100 tablet 1 Past Month at Unknown time  . pantoprazole (PROTONIX) 40 MG tablet Take 1 tablet (40 mg total) by mouth daily. (Patient taking differently: Take 40 mg by mouth daily as needed (for acid reflux). ) 30 tablet 2 Past Month at Unknown time  . Prenatal Vit-Fe Fumarate-FA (MULTIVITAMIN-PRENATAL) 27-0.8 MG TABS tablet Take 1 tablet by mouth at bedtime.     08/03/2016 at Unknown time     Review of Systems   All systems reviewed and negative except as stated in HPI  Blood pressure 117/81, pulse 87, temperature 97.1 F (36.2 C), temperature source Oral, resp. rate 18, height 5\' 4"  (1.626 m), weight 73.5 kg (162 lb), last menstrual period 10/20/2015, SpO2 100 %, unknown if currently breastfeeding. General appearance: alert and cooperative Lungs: clear to auscultation bilaterally Heart: regular rate and rhythm Abdomen: soft, non-tender; bowel sounds normal Extremities: No calf swelling or tenderness Presentation: cephalic Fetal monitoring:  FHR baseline 140s, mod variability, pos accels, no decels, contractions q 2-553min Uterine activity:  Dilation: 6.5 Effacement (%): 80 Station: -3 Exam by:: Craige CottaAmanda Loye, RNC   Prenatal labs: ABO, Rh: --/--/O POS (01/29 1326) Antibody: PENDING (01/29 1326) Rubella: 3.23 RPR: NON REAC (11/29 0001)  HBsAg: NEGATIVE (08/02 0849)  HIV: NONREACTIVE (11/29 0001)  GBS: Negative (01/25 0000)  1 hr Glucola: 124 Genetic screening: negative Anatomy US: normal female  Prenatal Transfer Tool  Maternal Diabetes: No Genetic Screening: Normal Maternal Ultrasounds/Referrals: Normal Fetal Ultrasounds or other Referrals:  None Maternal Substance Abuse:  No Significant Maternal Medications:  Meds include: Protonix Significant Maternal Lab Results: None  Results for orders placed or performed during the hospital encounter of 08/04/16 (from the past 24 hour(s))  CBC  Collection Time: 08/04/16  1:23 PM  Result Value Ref Range   WBC 10.2 4.0 - 10.5 K/uL   RBC 4.13 3.87 - 5.11 MIL/uL   Hemoglobin 11.2 (L) 12.0 - 15.0 g/dL   HCT 96.0 (L) 45.4 - 09.8 %   MCV 80.9 78.0 - 100.0 fL   MCH 27.1 26.0 - 34.0 pg   MCHC 33.5 30.0 - 36.0 g/dL   RDW 11.9 14.7 - 82.9 %   Platelets 264 150 - 400 K/uL  Type and screen [facility]   Collection Time: 08/04/16  1:26 PM  Result Value Ref Range   ABO/RH(D)  O POS    Antibody Screen PENDING    Sample Expiration 08/07/2016     Patient Active Problem List   Diagnosis Date Noted  . Normal labor 08/04/2016  . Poor weight gain of pregnancy, third trimester 07/31/2016  . Supervision of normal pregnancy, antepartum 02/06/2016  . Asthma, chronic 10/22/2013    Assessment: Deanna Castillo is a 26 y.o. G2P1001 at [redacted]w[redacted]d here for SOL.  #Labor: SOL, intact. Expectant management, anticipate SVD #Pain: epidural #FWB:  category I #ID: GBS neg #MOF: breast #MOC: unsure  Renne Musca, MD PGY-1 08/04/2016, 2:54 PM  The patient was seen and examined by me also Agree with note NST reactive and reassuring UCs as listed Cervical exams as listed in note Desires Epidural  Aviva Signs, CNM

## 2016-08-04 NOTE — Progress Notes (Signed)
S: Patient seen & examined for progress of labor. Patient comfortable with epidural.    O:  Vitals:   08/04/16 1924 08/04/16 1925 08/04/16 1931 08/04/16 2002  BP:   108/71 102/79  Pulse:  95 81 88  Resp:   16 16  Temp: 98.1 F (36.7 C)     TempSrc: Oral     SpO2:  98%    Weight:      Height:        Dilation: 7 Effacement (%): 90 Cervical Position: Middle Station: -2 Presentation: Vertex Exam by:: B Shella SpearingBoyer RN    FHT: 130s bpm, mod var, +accels, recurrent variable decelerations TOCO: q3-484min   A/P: IUPC placement without difficulty, with plan for potential amnioinfusion if decelerations persist Continue expectant management otherwise Anticipate SVD   Gorden HarmsMegan Divina Neale, MD PGY-2 08/04/2016 8:40 PM

## 2016-08-04 NOTE — Anesthesia Procedure Notes (Signed)
Epidural Patient location during procedure: OB Start time: 08/04/2016 2:35 PM End time: 08/04/2016 2:45 PM  Staffing Anesthesiologist: Lewie LoronGERMEROTH, Sapphira Harjo Performed: anesthesiologist   Preanesthetic Checklist Completed: patient identified, pre-op evaluation, timeout performed, IV checked, risks and benefits discussed and monitors and equipment checked  Epidural Patient position: sitting Prep: site prepped and draped and DuraPrep Patient monitoring: heart rate Approach: midline Location: L3-L4 Injection technique: LOR air and LOR saline  Needle:  Needle type: Tuohy  Needle gauge: 17 G Needle length: 9 cm Needle insertion depth: 7 cm Catheter type: closed end flexible Catheter size: 19 Gauge Catheter at skin depth: 13 cm Test dose: negative  Assessment Sensory level: T8 Events: blood not aspirated, injection not painful, no injection resistance, negative IV test and no paresthesia  Additional Notes Reason for block:procedure for pain

## 2016-08-04 NOTE — Anesthesia Preprocedure Evaluation (Addendum)
Anesthesia Evaluation  Patient identified by MRN, date of birth, ID band Patient awake    Reviewed: Allergy & Precautions, H&P , NPO status , Patient's Chart, lab work & pertinent test results  Airway Mallampati: II  TM Distance: >3 FB Neck ROM: Full    Dental no notable dental hx.    Pulmonary neg pulmonary ROS, asthma ,    Pulmonary exam normal breath sounds clear to auscultation       Cardiovascular negative cardio ROS Normal cardiovascular exam Rhythm:Regular Rate:Normal     Neuro/Psych negative neurological ROS  negative psych ROS   GI/Hepatic negative GI ROS, Neg liver ROS,   Endo/Other  negative endocrine ROS  Renal/GU negative Renal ROS     Musculoskeletal negative musculoskeletal ROS (+)   Abdominal   Peds  Hematology  (+) anemia ,   Anesthesia Other Findings   Reproductive/Obstetrics negative OB ROS                            Anesthesia Physical  Anesthesia Plan  ASA: II  Anesthesia Plan: Epidural   Post-op Pain Management:    Induction:   Airway Management Planned:   Additional Equipment:   Intra-op Plan:   Post-operative Plan:   Informed Consent: I have reviewed the patients History and Physical, chart, labs and discussed the procedure including the risks, benefits and alternatives for the proposed anesthesia with the patient or authorized representative who has indicated his/her understanding and acceptance.     Plan Discussed with:   Anesthesia Plan Comments:         Anesthesia Quick Evaluation

## 2016-08-04 NOTE — Progress Notes (Signed)
Stopped by to see patient and assess progress of labor.  Cervical check was 6.5cm, 80% and -2 station.  AROM at 1830 without complications.  Pain well controlled. Anticipate SVD.

## 2016-08-05 ENCOUNTER — Encounter (HOSPITAL_COMMUNITY): Payer: Self-pay | Admitting: *Deleted

## 2016-08-05 LAB — RPR: RPR Ser Ql: NONREACTIVE

## 2016-08-05 MED ORDER — BENZOCAINE-MENTHOL 20-0.5 % EX AERO: 1.0000 "application " | INHALATION_SPRAY | CUTANEOUS | Status: DC | PRN

## 2016-08-05 MED ORDER — ACETAMINOPHEN 325 MG PO TABS
650.0000 mg | ORAL_TABLET | ORAL | Status: DC | PRN
  Administered 2016-08-05: 650 mg via ORAL
  Filled 2016-08-05: qty 2

## 2016-08-05 MED ORDER — ONDANSETRON HCL 4 MG/2ML IJ SOLN: 4.0000 mg | INTRAMUSCULAR | Status: DC | PRN

## 2016-08-05 MED ORDER — PRENATAL MULTIVITAMIN CH
1.0000 | ORAL_TABLET | Freq: Every day | ORAL | Status: DC
  Administered 2016-08-05 – 2016-08-06 (×2): 1 via ORAL
  Filled 2016-08-05 (×2): qty 1

## 2016-08-05 MED ORDER — IBUPROFEN 600 MG PO TABS
600.0000 mg | ORAL_TABLET | Freq: Four times a day (QID) | ORAL | Status: DC
  Administered 2016-08-05 – 2016-08-06 (×6): 600 mg via ORAL
  Filled 2016-08-05 (×7): qty 1

## 2016-08-05 MED ORDER — TETANUS-DIPHTH-ACELL PERTUSSIS 5-2.5-18.5 LF-MCG/0.5 IM SUSP: 0.5000 mL | Freq: Once | INTRAMUSCULAR | Status: DC

## 2016-08-05 MED ORDER — ONDANSETRON HCL 4 MG PO TABS: 4.0000 mg | ORAL_TABLET | ORAL | Status: DC | PRN

## 2016-08-05 MED ORDER — DIPHENHYDRAMINE HCL 25 MG PO CAPS: 25.0000 mg | ORAL_CAPSULE | Freq: Four times a day (QID) | ORAL | Status: DC | PRN

## 2016-08-05 MED ORDER — SIMETHICONE 80 MG PO CHEW: 80.0000 mg | CHEWABLE_TABLET | ORAL | Status: DC | PRN

## 2016-08-05 MED ORDER — SENNOSIDES-DOCUSATE SODIUM 8.6-50 MG PO TABS
2.0000 | ORAL_TABLET | ORAL | Status: DC
  Administered 2016-08-06: 2 via ORAL
  Filled 2016-08-05: qty 2

## 2016-08-05 MED ORDER — DIBUCAINE 1 % RE OINT: 1.0000 "application " | TOPICAL_OINTMENT | RECTAL | Status: DC | PRN

## 2016-08-05 MED ORDER — WITCH HAZEL-GLYCERIN EX PADS: 1.0000 "application " | MEDICATED_PAD | CUTANEOUS | Status: DC | PRN

## 2016-08-05 MED ORDER — COCONUT OIL OIL: 1.0000 "application " | TOPICAL_OIL | Status: DC | PRN

## 2016-08-05 MED ORDER — ZOLPIDEM TARTRATE 5 MG PO TABS: 5.0000 mg | ORAL_TABLET | Freq: Every evening | ORAL | Status: DC | PRN

## 2016-08-05 NOTE — Progress Notes (Signed)
UR chart review completed.  

## 2016-08-05 NOTE — Anesthesia Postprocedure Evaluation (Signed)
Anesthesia Post Note  Patient: Deanna Castillo  Procedure(s) Performed: * No procedures listed *  Patient location during evaluation: Mother Baby Anesthesia Type: Epidural Level of consciousness: awake Pain management: pain level controlled Vital Signs Assessment: post-procedure vital signs reviewed and stable Respiratory status: spontaneous breathing Cardiovascular status: stable Postop Assessment: no headache, no backache, epidural receding and patient able to bend at knees Anesthetic complications: no        Last Vitals:  Vitals:   08/05/16 0250 08/05/16 0652  BP: (!) 107/59 113/68  Pulse: 80 85  Resp: 18 18  Temp:      Last Pain:  Vitals:   08/05/16 0753  TempSrc:   PainSc: 7    Pain Goal: Patients Stated Pain Goal: 1 (08/05/16 0753)               Edison PaceWILKERSON,Elihue Ebert

## 2016-08-05 NOTE — Lactation Note (Signed)
This note was copied from a baby's chart. Lactation Consultation Note  Patient Name: Deanna Castillo MVHQI'OToday's Date: 08/05/2016 Reason for consult: Initial assessment;Infant < 6lbs Mom sleeping when I entered room.  This is her second baby and newborn I 5815 hours old.  Baby has been to breast a few times but sleepy with last attempt.  Instructed to undress baby and do skin to skin to encourage feed.  Breastfeeding consultation services and support information given and reviewed.  Encouraged to call for assist/concerns prn.  Maternal Data Does the patient have breastfeeding experience prior to this delivery?: Yes  Feeding Feeding Type: Breast Fed Length of feed: 0 min  LATCH Score/Interventions                      Lactation Tools Discussed/Used     Consult Status Consult Status: Follow-up Date: 08/06/16 Follow-up type: In-patient    Huston FoleyMOULDEN, Evetta Renner S 08/05/2016, 3:35 PM

## 2016-08-05 NOTE — Progress Notes (Signed)
POSTPARTUM PROGRESS NOTE  Post Partum Day 1  Subjective:  Deanna Castillo is a 26 y.o. [id number] 5042w1d s/p VAVD following fetal intolerance after spontaneous onset of labor.  No acute events overnight.  Pt denies problems with ambulating, voiding or po intake.  She denies nausea or vomiting.  Pain is well controlled.  She has had flatus. She has not had bowel movement.  Lochia Small.   Objective: Blood pressure 113/68, pulse 85, temperature 98.2 F (36.8 C), temperature source Oral, resp. rate 18, height 5\' 4"  (1.626 m), weight 162 lb (73.5 kg), last menstrual period 10/20/2015, SpO2 100 %, unknown if currently breastfeeding.  Physical Exam:  General: alert, cooperative and no distress Lochia:normal flow Chest: CTAB Heart: RRR no m/r/g Abdomen: +BS, soft, nontender,  Uterine Fundus: firm, palpable just inferior to umbilicu DVT Evaluation: No calf swelling or tenderness Extremities: No lower extremity edema   Recent Labs  08/04/16 1323  HGB 11.2*  HCT 33.4*    Assessment/Plan:  ASSESSMENT: Deanna Babeammarie Touhey is a 26 y.o. [id number] 6942w1d s/p VAVD following fetal intolerance after spontaneous onset of labor.   Plan for discharge tomorrow   LOS: 1 day   Gorden HarmsMegan Campbell, MD PGY-2 08/05/2016, 8:44 AM   OB FELLOW POSTPARTUM PROGRESS NOTE ATTESTATION  I have seen and examined this patient and agree with above documentation in the resident's note.   Ernestina PennaNicholas Schenk, MD 8:47 AM

## 2016-08-06 MED ORDER — IBUPROFEN 600 MG PO TABS: 600.0000 mg | ORAL_TABLET | Freq: Four times a day (QID) | ORAL | 0 refills | Status: DC

## 2016-08-06 NOTE — Lactation Note (Addendum)
This note was copied from a baby's chart. Lactation Consultation Note  Patient Name: Deanna Castillo UVOZD'GToday's Date: 08/06/2016 Reason for consult: Follow-up assessment;Infant weight loss Baby is 6137 hours old and has been consistent at the breast flow sheets reviewed.  @ consult baby awake, LC [placed baby STS and assisted with pillow support and checked latch. Multiple swallows noted. Baby fed >10 mins . See doc flow sheets. Sore nipple and engorgement prevention and tx reviewed.  Mom has hand pump for D/C. And per mom feels comfortable with hand pump.  Mother informed of post-discharge support and given phone number to the lactation department, including services for phone call assistance; out-patient appointments; and breastfeeding support group. List of other breastfeeding resources in the community given in the handout. Encouraged mother to call for problems or concerns related to breastfeeding.    Maternal Data Has patient been taught Hand Expression?: Yes  Feeding Feeding Type: Breast Fed Length of feed: 12 min  LATCH Score/Interventions Latch: Grasps breast easily, tongue down, lips flanged, rhythmical sucking.  Audible Swallowing: Spontaneous and intermittent Intervention(s): Skin to skin;Hand expression Intervention(s): Skin to skin;Hand expression  Type of Nipple: Everted at rest and after stimulation  Comfort (Breast/Nipple): Soft / non-tender     Hold (Positioning): Assistance needed to correctly position infant at breast and maintain latch. Intervention(s): Breastfeeding basics reviewed;Support Pillows;Position options;Skin to skin  LATCH Score: 9  Lactation Tools Discussed/Used Tools: Pump Breast pump type: Manual [facility]IC [facility]   Consult Status Consult Status: Complete Date: 08/06/16 Follow-up type: In-patient    Deanna Castillo 08/06/2016, 12:56 PM

## 2016-08-06 NOTE — Discharge Instructions (Signed)

## 2016-08-06 NOTE — Discharge Summary (Signed)
OB Discharge Summary     Patient Name: Deanna Castillo DOB: [birth year] MRN: [id number]  Date of admission: 08/04/2016 Delivering MD: Lorne Skeens   Date of discharge: 08/06/2016  Admitting diagnosis: 38WKS,LABOR Intrauterine pregnancy: [redacted]w[redacted]d     Secondary diagnosis:  Active Problems:   Normal labor  Additional problems: none     Discharge diagnosis: Term Pregnancy Delivered                                                                                                Post partum procedures:none  Augmentation: AROM  Complications: None  Hospital course:  Onset of Labor With Vaginal Delivery / Vacuum assisted   26 y.o. yo G2P2002 at [redacted]w[redacted]d was admitted in Active Labor on 08/04/2016. Patient had an uncomplicated labor course as follows:  Membrane Rupture Time/Date: 6:34 PM ,08/04/2016   Intrapartum Procedures: Episiotomy: None [1]                                         Lacerations:  None [1]  Patient had a delivery of a Viable infant. 08/04/2016  Information for the patient's newborn:  Ernie, Sagrero [[id number]]       Pateint had an uncomplicated postpartum course.  She is ambulating, tolerating a regular diet, passing flatus, and urinating well. Patient is discharged home in stable condition on 08/06/16.   Physical exam  Vitals:   08/05/16 0652 08/05/16 1030 08/05/16 1800 08/06/16 0624  BP: 113/68 105/63 118/71 (!) 103/56  Pulse: 85 72 69 74  Resp: 18 18 18 18   Temp:  97.8 F (36.6 C) 98.3 F (36.8 C) 98.9 F (37.2 C)  TempSrc:  Oral Oral Oral  SpO2:      Weight:      Height:       General: alert, cooperative and no distress Lochia: appropriate Uterine Fundus: firm Incision: Healing well with no significant drainage DVT Evaluation: No evidence of DVT seen on physical exam. Labs: Lab Results  Component Value Date   WBC 10.2 08/04/2016   HGB 11.2 (L) 08/04/2016   HCT 33.4 (L) 08/04/2016   MCV 80.9 08/04/2016   PLT 264 08/04/2016   CMP  Latest Ref Rng & Units 03/12/2013  Glucose 70 - 99 mg/dL 80  BUN 6 - 23 mg/dL 5(L)  Creatinine 5.62 - 1.10 mg/dL 1.30  Sodium 865 - 784 mEq/L 132(L)  Potassium 3.5 - 5.1 mEq/L 3.7  Chloride 96 - 112 mEq/L 98  CO2 19 - 32 mEq/L 23  Calcium 8.4 - 10.5 mg/dL 9.4  Total Protein 6.0 - 8.3 g/dL 8.0  Total Bilirubin 0.3 - 1.2 mg/dL 0.3  Alkaline Phos 39 - 117 U/L 51  AST 0 - 37 U/L 15  ALT 0 - 35 U/L 10    Discharge instruction: per After Visit Summary and "Baby and Me Booklet".  After visit meds:  Allergies as of 08/06/2016   No Known Allergies     Medication List    TAKE these  medications   Doxylamine-Pyridoxine 10-10 MG Tbec Commonly known as:  DICLEGIS Take 1 tablet by mouth at bedtime as needed. What changed:  reasons to take this   ibuprofen 600 MG tablet Commonly known as:  ADVIL,MOTRIN Take 1 tablet (600 mg total) by mouth every 6 (six) hours.   multivitamin-prenatal 27-0.8 MG Tabs tablet Take 1 tablet by mouth at bedtime.   pantoprazole 40 MG tablet Commonly known as:  PROTONIX Take 1 tablet (40 mg total) by mouth daily. What changed:  when to take this  reasons to take this       Diet: routine diet  Activity: Advance as tolerated. Pelvic rest for 6 weeks.   Outpatient follow up:6 weeks Follow up Appt:No future appointments. Follow up Visit:No Follow-up on file.  Postpartum contraception: Nexplanon  Newborn Data: Live born female  Birth Weight: 5 lb 10.3 oz (2560 g) APGAR: 8, 9  Baby Feeding: Breast Disposition:home with mother   08/06/2016 Wynelle BourgeoisMarie Minsa Weddington, CNM

## 2016-08-06 NOTE — Lactation Note (Signed)
This note was copied from a baby's chart. Lactation Consultation Note  Patient Name: Deanna Castillo ZOXWR'UToday's Date: 08/06/2016 Reason for consult: Follow-up assessment;Infant weight loss  2nd LC visit today . Message received to go see patient. This LC went back to see mom and she mentioned she had not called. While LC was in the room baby was latched with depth,  multiple swallows noted, and both mom and baby comfortable.    Maternal Data Has patient been taught Hand Expression?: Yes  Feeding Feeding Type: Breast Fed Length of feed:  (per mom - start time, swallows noted, deep latch )  LATCH Score/Interventions Latch: Grasps breast easily, tongue down, lips flanged, rhythmical sucking.  Audible Swallowing: Spontaneous and intermittent  Type of Nipple: Everted at rest and after stimulation  Comfort (Breast/Nipple): Soft / non-tender     Hold (Positioning): Assistance needed to correctly position infant at breast and maintain latch. Intervention(s): Breastfeeding basics reviewed;Support Pillows;Position options;Skin to skin  LATCH Score: 9  Lactation Tools Discussed/Used Tools: Pump Breast pump type: Manual [facility]   Consult Status Consult Status: Complete Date: 08/06/16 Follow-up type: In-patient    Deanna Castillo 08/06/2016, 4:10 PM

## 2016-08-07 ENCOUNTER — Encounter: Payer: Medicaid Other | Admitting: Obstetrics & Gynecology

## 2016-09-09 ENCOUNTER — Ambulatory Visit: Payer: Medicaid Other | Admitting: Obstetrics and Gynecology

## 2016-09-23 ENCOUNTER — Ambulatory Visit (INDEPENDENT_AMBULATORY_CARE_PROVIDER_SITE_OTHER): Payer: Medicaid Other | Admitting: Advanced Practice Midwife

## 2016-09-23 ENCOUNTER — Encounter: Payer: Self-pay | Admitting: Advanced Practice Midwife

## 2016-09-23 DIAGNOSIS — Z30013 Encounter for initial prescription of injectable contraceptive: Secondary | ICD-10-CM | POA: Diagnosis not present

## 2016-09-23 DIAGNOSIS — Z3042 Encounter for surveillance of injectable contraceptive: Secondary | ICD-10-CM

## 2016-09-23 DIAGNOSIS — Z3202 Encounter for pregnancy test, result negative: Secondary | ICD-10-CM

## 2016-09-23 LAB — POCT PREGNANCY, URINE: PREG TEST UR: NEGATIVE

## 2016-09-23 MED ORDER — MEDROXYPROGESTERONE ACETATE 150 MG/ML IM SUSP
150.0000 mg | Freq: Once | INTRAMUSCULAR | Status: AC
  Administered 2016-09-23: 150 mg via INTRAMUSCULAR

## 2016-09-23 NOTE — Progress Notes (Signed)
Subjective:     Deanna Castillo is a 26 y.o. female who presents for a postpartum visit. She is 7 weeks postpartum following a spontaneous vaginal delivery (vacuum assisted).   I have fully reviewed the prenatal and intrapartum course. The delivery was at  gestational weeks. Outcome: vacuum, low. Anesthesia: epidural. Postpartum course has been uneventful.  Did have Intercourse last week and started period Saturday.. Baby's course has been uneventful. Baby is feeding by breast. Bleeding no bleeding. Bowel function is normal. Bladder function is normal. Patient is sexually active. Contraception method is Depo-Provera injections. Postpartum depression screening: negative.  The following portions of the patient's history were reviewed and updated as appropriate: allergies, current medications, past family history, past medical history, past social history, past surgical history and problem list.  Review of Systems Pertinent items are noted in HPI.   Objective:    BP 114/76   Pulse 66   Temp 98 F (36.7 C)   Ht 5\' 4"  (1.626 m)   Wt 150 lb 4.8 oz (68.2 kg)   LMP 09/21/2016 (Exact Date)   Breastfeeding? Yes   BMI 25.80 kg/m   General:  alert, cooperative and no distress   Breasts:  inspection negative, no nipple discharge or bleeding, no masses or nodularity palpable  Lungs: clear to auscultation bilaterally  Heart:  regular rate and rhythm, S1, S2 normal, no murmur, click, rub or gallop  Abdomen: soft, non-tender; bowel sounds normal; no masses,  no organomegaly   Vulva:  not evaluated  Vagina: not evaluated  Cervix:  Not examined  Corpus: not examined  Adnexa:  not evaluated  Rectal Exam: Not performed.        Assessment:     Normal postpartum exam. Pap smear not done at today's visit.   Plan:    1. Contraception: Depo-Provera injections  Discussed irregular bleeding for the first several months. Then will likely have amenorrhea.  Discussed risk of bone loss after 2 years of  injections. 2. DepoProvera given today. UPT neg.  OKed by Dr Jolayne Pantheronstant 3. Follow up in: 3 months or as needed.

## 2016-09-23 NOTE — Patient Instructions (Signed)
Hormonal Contraception Information Hormonal contraception is a type of birth control that uses hormones to prevent pregnancy. It usually involves a combination of the hormones estrogen and progesterone or only the hormone progesterone. Hormonal contraception works in these ways:  It thickens the mucus in the cervix, making it harder for sperm to enter the uterus.  It changes the lining of the uterus, making it harder for an egg to implant.  It may stop the ovaries from releasing eggs (ovulation). Some women who take hormonal contraceptives that contain only progesterone may continue to ovulate. Hormonal contraception cannot prevent sexually transmitted infections (STIs). Pregnancy may still occur. Estrogen and progesterone contraceptives  Contraceptives that use a combination of estrogen and progesterone are available in these forms:  Pill. Pills come in different combinations of hormones. They must be taken at the same time each day. Pills can affect your period, causing you to get your period once every three months or not at all.  Patch. The patch must be worn on the lower abdomen for three weeks and then removed on the fourth.  Vaginal ring. The ring is placed in the vagina and left there for three weeks. It is then removed for one week. Progesterone contraceptives Contraceptives that use progesterone only are available in these forms:  Pill. Pills should be taken every day of the cycle.  Intrauterine device (IUD). This device is inserted into the uterus and removed or replaced every five years or sooner.  Implant. Plastic rods are placed under the skin of the upper arm. They are removed or replaced every three years or sooner.  Injection. The injection is given once every 90 days. What are the side effects? The side effects of estrogen and progesterone contraceptives include:  Nausea.  Headaches.  Breast tenderness.  Bleeding or spotting between menstrual cycles.  High  blood pressure (rare).  Strokes, heart attacks, or blood clots (rare) Side effects of progesterone-only contraceptives include:  Nausea.  Headaches.  Breast tenderness.  Unpredictable menstrual bleeding.  High blood pressure (rare). Talk to your health care provider about what side effects may affect you. Where to find more information:  Ask your health care provider for more information and resources about hormonal contraception.  U.S[place] Department of Health and Human Services Office on Women's Health: www.womenshealth.gov Questions to ask:  What type of hormonal contraception is right for me?  How long should I plan to use hormonal contraception?  What are the side effects of the hormonal contraception method I choose?  How can I prevent STIs while using hormonal contraception? Contact a health care provider if:  You start taking hormonal contraceptives and you develop persistent or severe side effects. Summary  Estrogen and progesterone are hormones used in many forms of birth control.  Talk to your health care provider about what side effects may affect you.  Hormonal contraception cannot prevent sexually transmitted infections (STIs).  Ask your health care provider for more information and resources about hormonal contraception. This information is not intended to replace advice given to you by your health care provider. Make sure you discuss any questions you have with your health care provider. Document Released: 07/13/2007 Document Revised: 05/23/2016 Document Reviewed: 05/23/2016 Elsevier Interactive Patient Education  2017 Elsevier Inc.  

## 2016-12-17 ENCOUNTER — Ambulatory Visit: Payer: Medicaid Other

## 2017-06-05 ENCOUNTER — Telehealth: Payer: Self-pay | Admitting: Internal Medicine

## 2017-06-05 NOTE — Telephone Encounter (Signed)
Dr. Yetta BarreJones is not the patients PCP and she is not a patient at our office??

## 2017-06-05 NOTE — Telephone Encounter (Signed)
Copied from CRM [id number]. Topic: Quick Communication - See Telephone Encounter >> Jun 05, 2017  4:10 PM Diana EvesHoyt, Maryann B wrote: CRM for notification. See Telephone encounter for:  Pt is in need of an inhaler [facility] was suppose to fax something yesterday per pts mother. Pt needing this done TODAY Coverage ends today 06/05/17.

## 2017-10-15 IMAGING — US US OB TRANSVAGINAL
1 series · 15 of 28 positions shown · non-contrast
Comparison: None.

CLINICAL DATA: Cramping and sharp left pain for 2 days.

EXAM:
OBSTETRIC <14 WK US AND TRANSVAGINAL OB US
TECHNIQUE: Both transabdominal and transvaginal ultrasound examinations were
performed for complete evaluation of the gestation as well as the
maternal uterus, adnexal regions, and pelvic cul-de-sac.
Transvaginal technique was performed to assess early pregnancy.

[Series 1: us ob transvaginal · 15 of 52 slices shown]
[im 1/52]
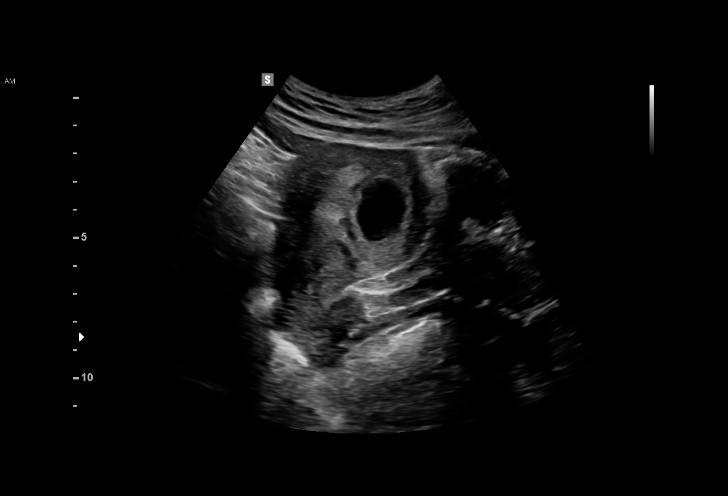
[im 4/52]
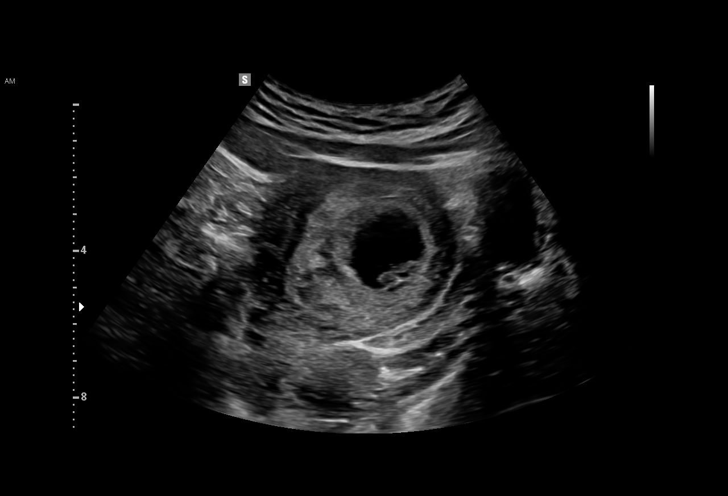
[im 8/52]
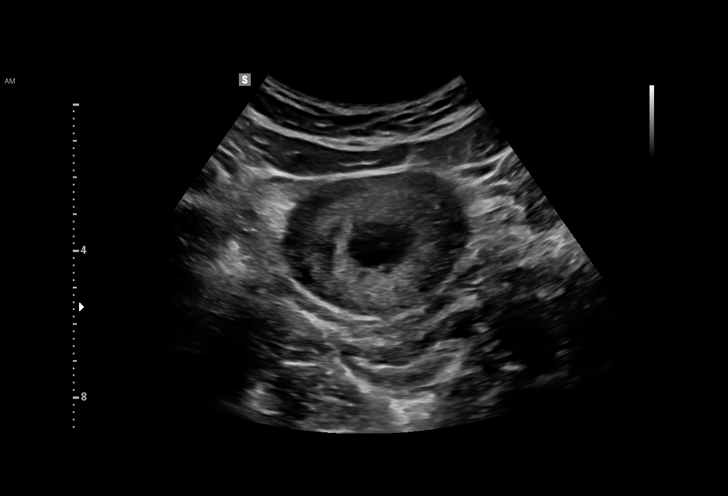
[im 12/52]
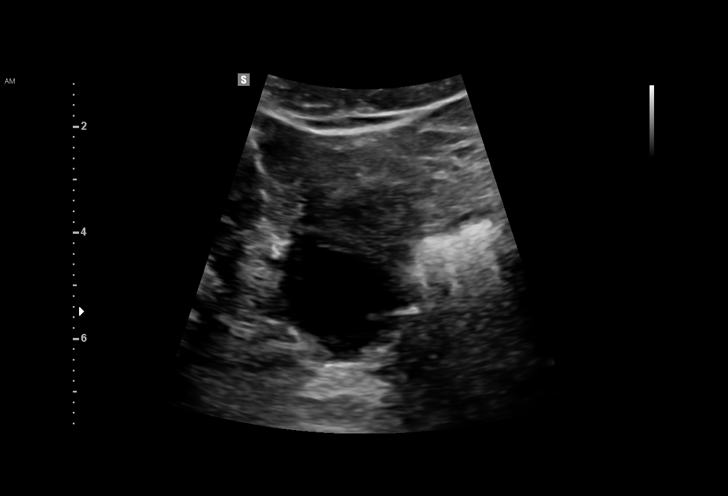
[im 16/52]
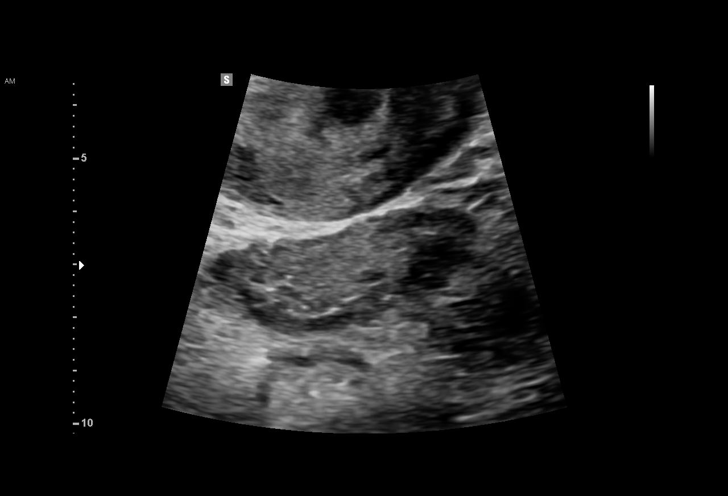
[im 19/52]
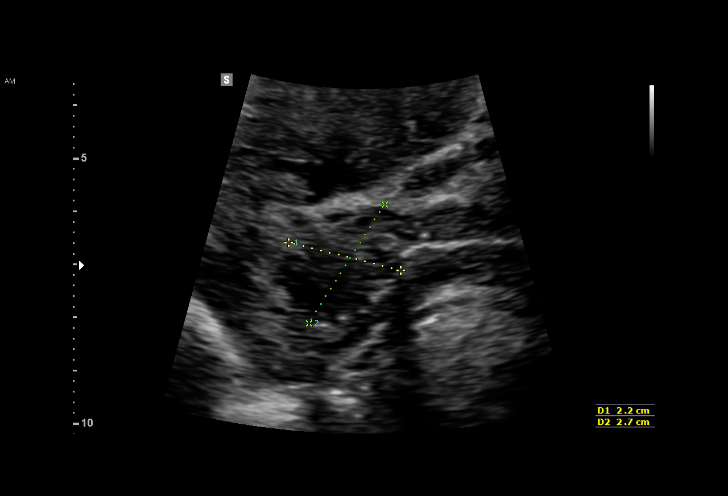
[im 23/52]
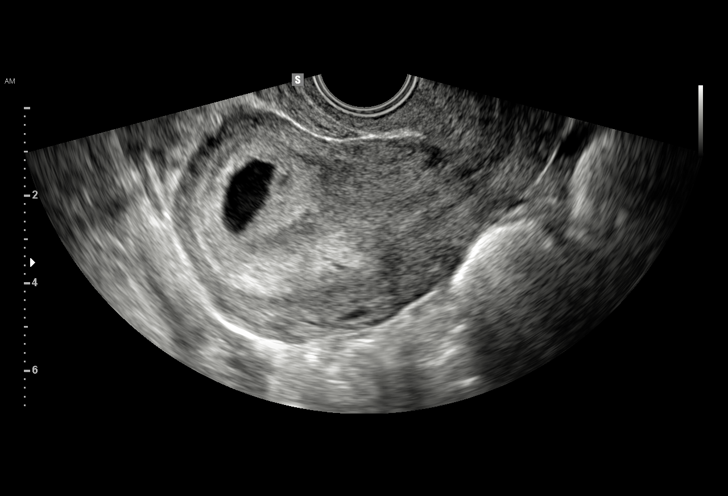
[im 27/52]
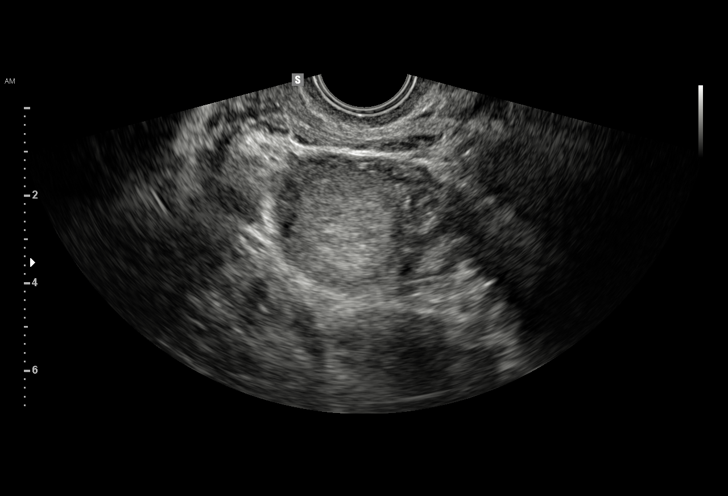
[im 29/52]
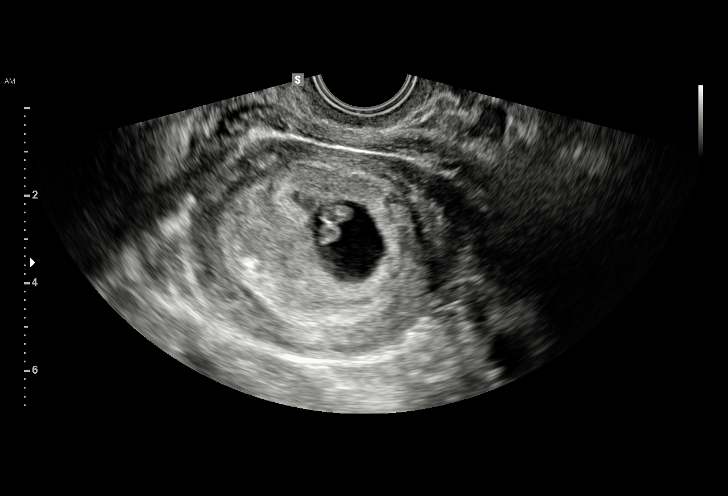
[im 33/52]
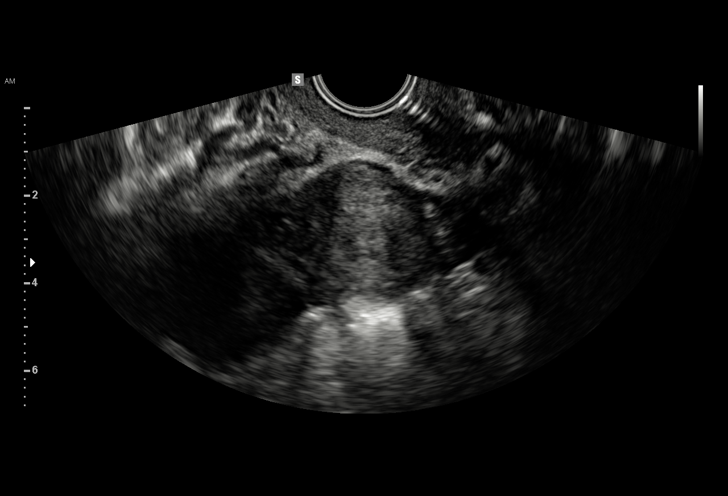
[im 36/52]
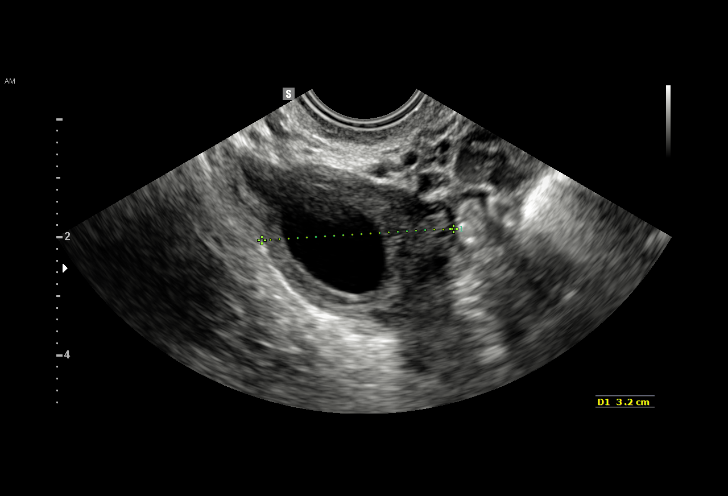
[im 40/52]
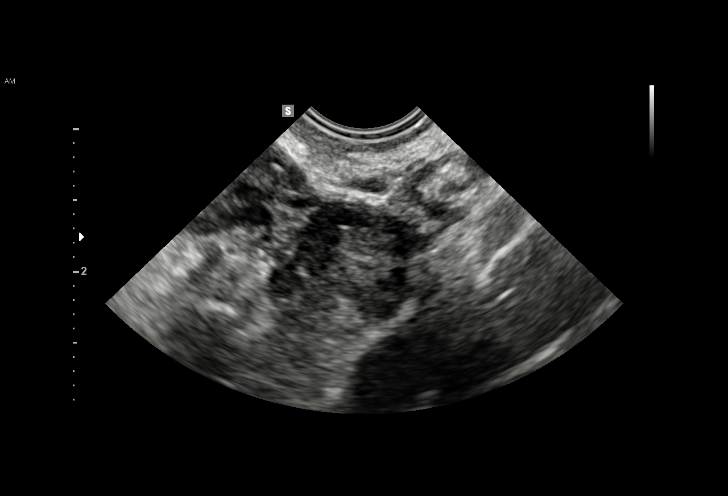
[im 44/52]
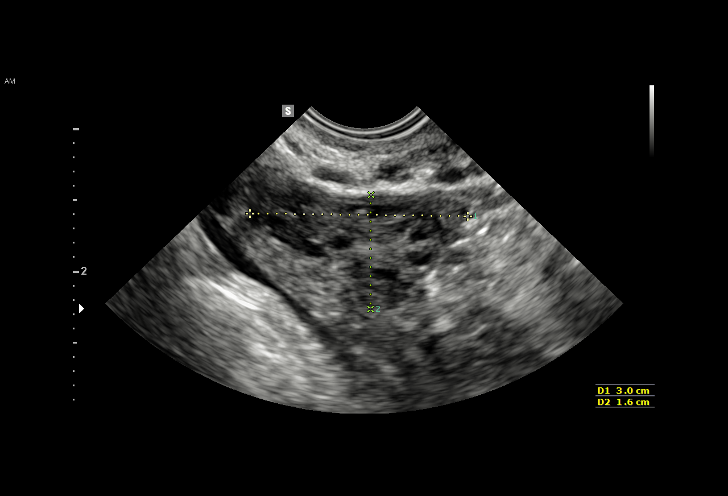
[im 48/52]
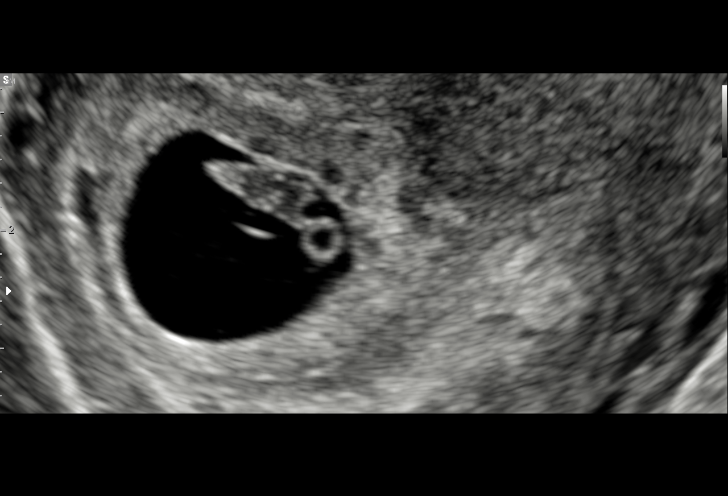
[im 52/52]
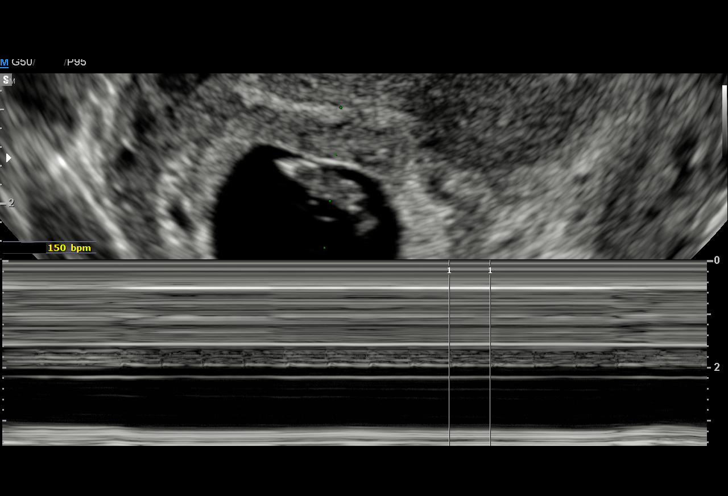

[15 of 28 positions shown; findings below may reference images not displayed]

FINDINGS: Intrauterine gestational sac: A single intrauterine
pregnancy/gestational sac is identified.

Yolk sac:  A single yolk sac is seen.

Embryo:  A single embryo is identified.

Cardiac Activity: Present.

Heart Rate: 150  bpm

CRL: 1.1 cm mm 7 w 1 d US EDC: August 17, 2016

Subchorionic hemorrhage:  None visualized.

Maternal uterus/adnexae: The ovaries are unremarkable.
IMPRESSION: Single live intrauterine pregnancy.

## 2017-11-03 ENCOUNTER — Encounter: Payer: Self-pay | Admitting: *Deleted

## 2019-06-29 ENCOUNTER — Other Ambulatory Visit: Payer: Self-pay

## 2019-07-06 ENCOUNTER — Ambulatory Visit: Payer: Self-pay | Attending: Internal Medicine

## 2020-05-22 ENCOUNTER — Other Ambulatory Visit: Payer: Self-pay

## 2020-05-24 ENCOUNTER — Other Ambulatory Visit: Payer: Self-pay

## 2020-05-24 DIAGNOSIS — Z20822 Contact with and (suspected) exposure to covid-19: Secondary | ICD-10-CM

## 2020-05-25 LAB — NOVEL CORONAVIRUS, NAA: SARS-CoV-2, NAA: NOT DETECTED

## 2020-05-25 LAB — SARS-COV-2, NAA 2 DAY TAT

## 2020-06-06 ENCOUNTER — Ambulatory Visit (INDEPENDENT_AMBULATORY_CARE_PROVIDER_SITE_OTHER): Payer: Self-pay | Admitting: General Practice

## 2020-06-06 ENCOUNTER — Other Ambulatory Visit: Payer: Self-pay

## 2020-06-06 DIAGNOSIS — Z3A01 Less than 8 weeks gestation of pregnancy: Secondary | ICD-10-CM

## 2020-06-06 DIAGNOSIS — Z3201 Encounter for pregnancy test, result positive: Secondary | ICD-10-CM

## 2020-06-06 LAB — POCT PREGNANCY, URINE: Preg Test, Ur: POSITIVE — AB

## 2020-06-06 MED ORDER — PROMETHAZINE HCL 25 MG PO TABS: 25.0000 mg | ORAL_TABLET | Freq: Four times a day (QID) | ORAL | 0 refills | Status: DC | PRN

## 2020-06-06 MED ORDER — PRENATAL PLUS 27-1 MG PO TABS: 1.0000 | ORAL_TABLET | Freq: Every day | ORAL | 11 refills | Status: DC

## 2020-06-06 NOTE — Progress Notes (Signed)
Patient dropped off urine sample at office for UPT. UPT +.  Attempted to call patient, no answer- left message requesting she call us back regarding recent appointment.   Called patient and informed her of + UPT. Patient verbalized understanding & reports first positive home test 1 week. LMP 04/30/20 EDD 02/04/21 [redacted]w[redacted]d. She does report difficulty with nausea/vomiting. Phenergan & PNV Rx sent to pharmacy per protocol & patient informed. Discussed new OB appt around 10-12 weeks & someone from the front office would contact her with an appt. Patient verbalized understanding.  Chase Caller RN BSN 06/06/20

## 2020-06-21 NOTE — Progress Notes (Signed)
Chart reviewed for nurse visit. Agree with plan of care.   Zen Felling Lorraine, CNM 06/21/2020 7:19 PM   

## 2020-07-07 NOTE — L&D Delivery Note (Signed)
OB/GYN Faculty Practice Delivery Note  Deanna Castillo is a 30 y.o. G3P2002 s/p SVD at [redacted]w[redacted]d. She was admitted for labor.   ROM: 0h 91m with clear fluid GBS Status:  Positive/-- (06/27 1155) Maximum Maternal Temperature: 98.2  Labor Progress: Initial SVE: 6cm. She then progressed to complete.   Delivery Date/Time: 08:51 on 7/2  Delivery: Called to room and patient was complete. Patient evaluated by myself and Anyanwu and noted to be in mentum anterior position at +4 station. Patient instructed to push. Head delivered mentum anterior. No nuchal cord present. Shoulder and body delivered in usual fashion. Infant with spontaneous cry, placed on mother's abdomen, dried and stimulated. Cord clamped x 2 after 1-minute delay, and cut by FOB. Cord blood drawn. Placenta delivered spontaneously with gentle cord traction. Fundus firm with massage and Pitocin. Labia, perineum, vagina, and cervix inspected inspected with no lacerations.  Baby Weight: pending  Placenta: Home with mother  Complications: None Lacerations: none EBL: 50 mL Analgesia: Epidural   Infant:  APGAR (1 MIN):  pending APGAR (5 MINS):   APGAR (10 MINS):     Casper Harrison, MD [facility] Family Medicine Fellow, [facility] for [facility], [facility] 01/05/2021, 9:04 AM

## 2020-07-11 ENCOUNTER — Encounter: Payer: Self-pay | Admitting: Certified Nurse Midwife

## 2020-07-11 ENCOUNTER — Other Ambulatory Visit: Payer: Self-pay

## 2020-07-11 ENCOUNTER — Ambulatory Visit (INDEPENDENT_AMBULATORY_CARE_PROVIDER_SITE_OTHER): Payer: Self-pay | Admitting: Certified Nurse Midwife

## 2020-07-11 ENCOUNTER — Other Ambulatory Visit (HOSPITAL_COMMUNITY)
Admission: RE | Admit: 2020-07-11 | Discharge: 2020-07-11 | Disposition: A | Discharge status: Home / Self Care | Payer: Medicaid Other | Source: Ambulatory Visit | Attending: Certified Nurse Midwife | Admitting: Certified Nurse Midwife

## 2020-07-11 VITALS — BP 125/81 | HR 73 | Wt 145.1 lb

## 2020-07-11 DIAGNOSIS — Z3A10 10 weeks gestation of pregnancy: Secondary | ICD-10-CM

## 2020-07-11 DIAGNOSIS — Z3481 Encounter for supervision of other normal pregnancy, first trimester: Secondary | ICD-10-CM | POA: Diagnosis not present

## 2020-07-11 DIAGNOSIS — Z3401 Encounter for supervision of normal first pregnancy, first trimester: Secondary | ICD-10-CM

## 2020-07-11 DIAGNOSIS — Z3400 Encounter for supervision of normal first pregnancy, unspecified trimester: Secondary | ICD-10-CM | POA: Insufficient documentation

## 2020-07-11 DIAGNOSIS — O219 Vomiting of pregnancy, unspecified: Secondary | ICD-10-CM

## 2020-07-11 DIAGNOSIS — Z315 Encounter for genetic counseling: Secondary | ICD-10-CM | POA: Diagnosis not present

## 2020-07-11 DIAGNOSIS — N949 Unspecified condition associated with female genital organs and menstrual cycle: Secondary | ICD-10-CM

## 2020-07-11 LAB — POCT URINALYSIS DIP (DEVICE)
Bilirubin Urine: NEGATIVE
Glucose, UA: NEGATIVE mg/dL
Ketones, ur: NEGATIVE mg/dL
Leukocytes,Ua: NEGATIVE
Nitrite: NEGATIVE
Protein, ur: NEGATIVE mg/dL
Specific Gravity, Urine: >=1.03 (ref 1.005–1.030)
Urobilinogen, UA: 0.2 mg/dL (ref 0.0–1.0)
pH: 6.5 (ref 5.0–8.0)

## 2020-07-11 MED ORDER — BLOOD PRESSURE KIT DEVI: 1.0000 | Freq: Once | 0 refills | Status: AC

## 2020-07-11 NOTE — Progress Notes (Signed)
History:   Deanna Castillo is a 30 y.o. G3P2002 at 58w2dby LMP being seen today for her first obstetrical visit.  Her obstetrical history is not significant. Patient does intend to breast feed. Pregnancy history fully reviewed.  Patient reports some remaining morning sickness and round ligament pains.  HISTORY: OB History  Gravida Para Term Preterm AB Living  '3 2 2 ' 0 0 2  SAB IAB Ectopic Multiple Live Births  0 0 0 0 2    # Outcome Date GA Lbr Len/2nd Weight Sex Delivery Anes PTL Lv  3 Current           2 Term 08/04/16 [id number]:30 5 lb 10.3 oz (2.56 kg) F Vag-Vacuum EPI  LIV     Name: Kittleson,GIRL Nguyen     Apgar1: 8  Apgar5: 9  1 Term 10/23/13 3834w6d:13 / 00:53 6 lb 1.9 oz (2.775 kg) M Vag-Spont EPI  LIV     Name: Schirtzinger,BOY Sidni     Apgar1: 8  Apgar5: 9    Last pap smear was done 2017 and was normal  Past Medical History:  Diagnosis Date  . Asthma   . Normal labor 08/04/2016  . Poor weight gain of pregnancy, third trimester 07/31/2016   Lost weight initially, but gaining well third trimester. Watch fundal height. Growth US [place].    Past Surgical History:  Procedure Laterality Date  . NO PAST SURGERIES     Family History  Problem Relation Age of Onset  . Lung cancer Maternal Grandmother   . Hypertension Mother   . Diabetes Maternal Aunt    Social History   Tobacco Use  . Smoking status: Never Smoker  . Smokeless tobacco: Never Used  Vaping Use  . Vaping Use: Never used  Substance Use Topics  . Alcohol use: Not Currently    Comment: Rarely  . Drug use: Not Currently    Types: Marijuana   Allergies  Allergen Reactions  . Other Anaphylaxis    Cat dander-- "respiratory distress"   Current Outpatient Medications on File Prior to Visit  Medication Sig Dispense Refill  . prenatal vitamin w/FE, FA (PRENATAL 1 + 1) 27-1 MG TABS tablet Take 1 tablet by mouth daily at 12 noon. 30 tablet 11  . promethazine (PHENERGAN) 25 MG tablet Take 1 tablet (25 mg total)  by mouth every 6 (six) hours as needed for nausea or vomiting. 30 tablet 0   No current facility-administered medications on file prior to visit.    Review of Systems Pertinent items noted in HPI and remainder of comprehensive ROS otherwise negative. Physical Exam:   Vitals:   07/11/20 0907  BP: 125/81  Pulse: 73  Weight: 145 lb 1.6 oz (65.8 kg)   Fetal Heart Rate (bpm): 161 Bedside Ultrasound for FHR check: To verify singleton pregnancy - twin pregnancies run in both sides of pt and FOB families, FHT [place] easily [place] in two different locations. Single fetal heart rate: 161bpm Patient informed that the ultrasound is considered a limited obstetric ultrasound and is not intended to be a complete ultrasound exam.  Patient also informed that the ultrasound is not being completed with the intent of assessing for fetal or placental anomalies or any pelvic abnormalities.  Explained that the purpose of today's ultrasound is to assess for fetal heart rate.  Patient acknowledges the purpose of the exam and the limitations of the study. Uterus:  Fundal Height: 10 cm  Pelvic Exam: Perineum: no hemorrhoids,  normal perineum   Vulva: normal external genitalia, no lesions   Vagina:  normal mucosa, normal discharge   Cervix: no lesions and normal, pap smear done.    Adnexa: normal adnexa and no mass, fullness, tenderness   Bony Pelvis: average  System: General: well-developed, well-nourished female in no acute distress   Breasts:  normal appearance, no masses or tenderness bilaterally   Skin: normal coloration and turgor, no rashes   Neurologic: oriented, normal, negative, normal mood   Extremities: normal strength, tone, and muscle mass, ROM of all joints is normal   HEENT PERRLA, extraocular movement intact and sclera clear, anicteric   Mouth/Teeth mucous membranes moist, pharynx normal without lesions and dental hygiene good   Neck supple and no masses   Cardiovascular: regular rate and  rhythm   Respiratory:  no respiratory distress, normal breath sounds   Abdomen: soft, non-tender; bowel sounds normal; no masses,  no organomegaly    Assessment:    Pregnancy: [id number] Patient Active Problem List   Diagnosis Date Noted  . Supervision of low-risk first pregnancy 07/11/2020  . Asthma, chronic 10/22/2013     Plan:    1. Encounter for supervision of low-risk first pregnancy in first trimester - Genetic Screening - CBC/D/Plt+RPR+Rh+ABO+Rub Ab... - CHL AMB BABYSCRIPTS SCHEDULE OPTIMIZATION - Culture, OB Urine - Cytology - PAP( [place]) - Blood Pressure Monitoring (BLOOD PRESSURE KIT) DEVI; 1 Device by Does not apply route once for 1 dose.  Dispense: 1 each; Refill: 0 - [place] MFM OB DETAIL +14 WK; Future  2. [redacted] weeks gestation of pregnancy - Feeling overall well, just some remaining nausea  3. Nausea and vomiting during pregnancy - Prescribed phenergan which helps but makes her sleepy - Suggested trying ginger pills + smoothie sipping first thing in the morning, pt amenable to trying this so she can get off of phenergan  4. Round ligament pain - Demonstrated round ligament massage and stretches, encouraged her to do both daily throughout pregnancy  Initial labs drawn. Continue prenatal vitamins. Problem list reviewed and updated. Genetic Screening discussed, First trimester screen, Quad screen and NIPS: ordered. Ultrasound discussed; fetal anatomic survey: ordered. Anticipatory guidance about prenatal visits given including labs, ultrasounds, and testing. Discussed usage of Babyscripts and virtual visits as additional source of managing and completing prenatal visits in midst of coronavirus and pandemic.   Encouraged to complete MyChart Registration for her ability to review results, send requests, and have questions addressed.  The nature of [place] for [facility] Healthcare/Faculty Practice with multiple MDs and Advanced Practice Providers was explained  to patient; also emphasized that residents, students are part of our team. Routine obstetric precautions reviewed. Encouraged to seek out care at office or emergency room [facility] preferred) for urgent and/or emergent concerns. Return in about 4 weeks (around 08/08/2020) for VIRTUAL, LOB.    Next visit is virtual on 08/11/19 at 9:15am with Kerry Hough, PA-C  Gaylan Gerold, MSN, CNM, [place] Certified Nurse Midwife, [place]

## 2020-07-11 NOTE — Patient Instructions (Signed)
First Trimester of Pregnancy  The first trimester of pregnancy is from week 1 until the end of week 13 (months 1 through 3). During this time, your baby will begin to develop inside you. At 6-8 weeks, the eyes and face are formed, and the heartbeat can be seen on ultrasound. At the end of 12 weeks, all the baby's organs are formed. Prenatal care is all the medical care you receive before the birth of your baby. Make sure you get good prenatal care and follow all of your doctor's instructions. Follow these instructions at home: Medicines  Take over-the-counter and prescription medicines only as told by your doctor. Some medicines are safe and some medicines are not safe during pregnancy.  Take a prenatal vitamin that contains at least 600 micrograms (mcg) of folic acid.  If you have trouble pooping (constipation), take medicine that will make your stool soft (stool softener) if your doctor approves. Eating and drinking   Eat regular, healthy meals.  Your doctor will tell you the amount of weight gain that is right for you.  Avoid raw meat and uncooked cheese.  If you feel sick to your stomach (nauseous) or throw up (vomit): ? Eat 4 or 5 small meals a day instead of 3 large meals. ? Try eating a few soda crackers. ? Drink liquids between meals instead of during meals.  To prevent constipation: ? Eat foods that are high in fiber, like fresh fruits and vegetables, whole grains, and beans. ? Drink enough fluids to keep your pee (urine) clear or pale yellow. Activity  Exercise only as told by your doctor. Stop exercising if you have cramps or pain in your lower belly (abdomen) or low back.  Do not exercise if it is too hot, too humid, or if you are in a place of great height (high altitude).  Try to avoid standing for long periods of time. Move your legs often if you must stand in one place for a long time.  Avoid heavy lifting.  Wear low-heeled shoes. Sit and stand up  straight.  You can have sex unless your doctor tells you not to. Relieving pain and discomfort  Wear a good support bra if your breasts are sore.  Take warm water baths (sitz baths) to soothe pain or discomfort caused by hemorrhoids. Use hemorrhoid cream if your doctor says it is okay.  Rest with your legs raised if you have leg cramps or low back pain.  If you have puffy, bulging veins (varicose veins) in your legs: ? Wear support hose or compression stockings as told by your doctor. ? Raise (elevate) your feet for 15 minutes, 3-4 times a day. ? Limit salt in your food. Prenatal care  Schedule your prenatal visits by the twelfth week of pregnancy.  Write down your questions. Take them to your prenatal visits.  Keep all your prenatal visits as told by your doctor. This is important. Safety  Wear your seat belt at all times when driving.  Make a list of emergency phone numbers. The list should include numbers for family, friends, the hospital, and police and fire departments. General instructions  Ask your doctor for a referral to a local prenatal class. Begin classes no later than at the start of month 6 of your pregnancy.  Ask for help if you need counseling or if you need help with nutrition. Your doctor can give you advice or tell you where to go for help.  Do not use hot tubs, steam   rooms, or saunas.  Do not douche or use tampons or scented sanitary pads.  Do not cross your legs for long periods of time.  Avoid all herbs and alcohol. Avoid drugs that are not approved by your doctor.  Do not use any tobacco products, including cigarettes, chewing tobacco, and electronic cigarettes. If you need help quitting, ask your doctor. You may get counseling or other support to help you quit.  Avoid cat litter boxes and soil used by cats. These carry germs that can cause birth defects in the baby and can cause a loss of your baby (miscarriage) or stillbirth.  Visit your dentist.  At home, brush your teeth with a soft toothbrush. Be gentle when you floss. Contact a doctor if:  You are dizzy.  You have mild cramps or pressure in your lower belly.  You have a nagging pain in your belly area.  You continue to feel sick to your stomach, you throw up, or you have watery poop (diarrhea).  You have a bad smelling fluid coming from your vagina.  You have pain when you pee (urinate).  You have increased puffiness (swelling) in your face, hands, legs, or ankles. Get help right away if:  You have a fever.  You are leaking fluid from your vagina.  You have spotting or bleeding from your vagina.  You have very bad belly cramping or pain.  You gain or lose weight rapidly.  You throw up blood. It may look like coffee grounds.  You are around people who have [place] measles, fifth disease, or chickenpox.  You have a very bad headache.  You have shortness of breath.  You have any kind of trauma, such as from a fall or a car accident. Summary  The first trimester of pregnancy is from week 1 until the end of week 13 (months 1 through 3).  To take care of yourself and your unborn baby, you will need to eat healthy meals, take medicines only if your doctor tells you to do so, and do activities that are safe for you and your baby.  Keep all follow-up visits as told by your doctor. This is important as your doctor will have to ensure that your baby is healthy and growing well. This information is not intended to replace advice given to you by your health care provider. Make sure you discuss any questions you have with your health care provider. Document Revised: 10/14/2018 Document Reviewed: 07/01/2016 Elsevier Patient Education  2020 [employer].   Second Trimester of Pregnancy  The second trimester is from week 14 through week 27 (month 4 through 6). This is often the time in pregnancy that you feel your best. Often times, morning sickness has lessened or quit.  You may have more energy, and you may get hungry more often. Your unborn baby is growing rapidly. At the end of the sixth month, he or she is about 9 inches long and weighs about 1 pounds. You will likely feel the baby move between 18 and 20 weeks of pregnancy. Follow these instructions at home: Medicines  Take over-the-counter and prescription medicines only as told by your doctor. Some medicines are safe and some medicines are not safe during pregnancy.  Take a prenatal vitamin that contains at least 600 micrograms (mcg) of folic acid.  If you have trouble pooping (constipation), take medicine that will make your stool soft (stool softener) if your doctor approves. Eating and drinking   Eat regular, healthy meals.  Avoid  raw meat and uncooked cheese.  If you get low calcium from the food you eat, talk to your doctor about taking a daily calcium supplement.  Avoid foods that are high in fat and sugars, such as fried and sweet foods.  If you feel sick to your stomach (nauseous) or throw up (vomit): ? Eat 4 or 5 small meals a day instead of 3 large meals. ? Try eating a few soda crackers. ? Drink liquids between meals instead of during meals.  To prevent constipation: ? Eat foods that are high in fiber, like fresh fruits and vegetables, whole grains, and beans. ? Drink enough fluids to keep your pee (urine) clear or pale yellow. Activity  Exercise only as told by your doctor. Stop exercising if you start to have cramps.  Do not exercise if it is too hot, too humid, or if you are in a place of great height (high altitude).  Avoid heavy lifting.  Wear low-heeled shoes. Sit and stand up straight.  You can continue to have sex unless your doctor tells you not to. Relieving pain and discomfort  Wear a good support bra if your breasts are tender.  Take warm water baths (sitz baths) to soothe pain or discomfort caused by hemorrhoids. Use hemorrhoid cream if your doctor  approves.  Rest with your legs raised if you have leg cramps or low back pain.  If you develop puffy, bulging veins (varicose veins) in your legs: ? Wear support hose or compression stockings as told by your doctor. ? Raise (elevate) your feet for 15 minutes, 3-4 times a day. ? Limit salt in your food. Prenatal care  Write down your questions. Take them to your prenatal visits.  Keep all your prenatal visits as told by your doctor. This is important. Safety  Wear your seat belt when driving.  Make a list of emergency phone numbers, including numbers for family, friends, the hospital, and police and fire departments. General instructions  Ask your doctor about the right foods to eat or for help finding a counselor, if you need these services.  Ask your doctor about local prenatal classes. Begin classes before month 6 of your pregnancy.  Do not use hot tubs, steam rooms, or saunas.  Do not douche or use tampons or scented sanitary pads.  Do not cross your legs for long periods of time.  Visit your dentist if you have not done so. Use a soft toothbrush to brush your teeth. Floss gently.  Avoid all smoking, herbs, and alcohol. Avoid drugs that are not approved by your doctor.  Do not use any products that contain nicotine or tobacco, such as cigarettes and e-cigarettes. If you need help quitting, ask your doctor.  Avoid cat litter boxes and soil used by cats. These carry germs that can cause birth defects in the baby and can cause a loss of your baby (miscarriage) or stillbirth. Contact a doctor if:  You have mild cramps or pressure in your lower belly.  You have pain when you pee (urinate).  You have bad smelling fluid coming from your vagina.  You continue to feel sick to your stomach (nauseous), throw up (vomit), or have watery poop (diarrhea).  You have a nagging pain in your belly area.  You feel dizzy. Get help right away if:  You have a fever.  You are leaking  fluid from your vagina.  You have spotting or bleeding from your vagina.  You have severe belly cramping or pain.  You lose or gain weight rapidly.  You have trouble catching your breath and have chest pain.  You notice sudden or extreme puffiness (swelling) of your face, hands, ankles, feet, or legs.  You have not felt the baby move in over an hour.  You have severe headaches that do not go away when you take medicine.  You have trouble seeing. Summary  The second trimester is from week 14 through week 27 (months 4 through 6). This is often the time in pregnancy that you feel your best.  To take care of yourself and your unborn baby, you will need to eat healthy meals, take medicines only if your doctor tells you to do so, and do activities that are safe for you and your baby.  Call your doctor if you get sick or if you notice anything unusual about your pregnancy. Also, call your doctor if you need help with the right food to eat, or if you want to know what activities are safe for you. This information is not intended to replace advice given to you by your health care provider. Make sure you discuss any questions you have with your health care provider. Document Revised: 10/15/2018 Document Reviewed: 07/29/2016 Elsevier Patient Education  [employer].

## 2020-07-12 ENCOUNTER — Encounter: Payer: Self-pay | Admitting: *Deleted

## 2020-07-12 LAB — CBC/D/PLT+RPR+RH+ABO+RUB AB...
Antibody Screen: NEGATIVE
Basophils Absolute: 0 10*3/uL (ref 0.0–0.2)
Basos: 0 %
EOS (ABSOLUTE): 0.1 10*3/uL (ref 0.0–0.4)
Eos: 1 %
HCV Ab: <0.1 s/co ratio (ref 0.0–0.9)
HIV Screen 4th Generation wRfx: NONREACTIVE
Hematocrit: 35.2 % (ref 34.0–46.6)
Hemoglobin: 11.7 g/dL (ref 11.1–15.9)
Hepatitis B Surface Ag: NEGATIVE
Immature Grans (Abs): 0 10*3/uL (ref 0.0–0.1)
Immature Granulocytes: 0 %
Lymphocytes Absolute: 3 10*3/uL (ref 0.7–3.1)
Lymphs: 30 %
MCH: 29.2 pg (ref 26.6–33.0)
MCHC: 33.2 g/dL (ref 31.5–35.7)
MCV: 88 fL (ref 79–97)
Monocytes Absolute: 0.7 10*3/uL (ref 0.1–0.9)
Monocytes: 7 %
Neutrophils Absolute: 6.2 10*3/uL (ref 1.4–7.0)
Neutrophils: 62 %
Platelets: 260 10*3/uL (ref 150–450)
RBC: 4.01 x10E6/uL (ref 3.77–5.28)
RDW: 14.3 % (ref 11.7–15.4)
RPR Ser Ql: NONREACTIVE
Rh Factor: POSITIVE
Rubella Antibodies, IGG: 4.59 index (ref >0.99)
WBC: 10 10*3/uL (ref 3.4–10.8)

## 2020-07-12 LAB — CYTOLOGY - PAP
Chlamydia: NEGATIVE
Comment: NEGATIVE
Comment: NEGATIVE
Comment: NORMAL
Diagnosis: NEGATIVE
Neisseria Gonorrhea: NEGATIVE
Trichomonas: NEGATIVE

## 2020-07-12 LAB — HCV INTERPRETATION

## 2020-07-13 LAB — CULTURE, OB URINE

## 2020-07-13 LAB — URINE CULTURE, OB REFLEX

## 2020-07-16 ENCOUNTER — Telehealth: Payer: Self-pay | Admitting: Lactation Services

## 2020-07-16 NOTE — Telephone Encounter (Signed)
[employer] called and left message that they need to verify [name] is a provider in the office and what her NPI number is, they are requesting a call back.

## 2020-07-17 NOTE — Telephone Encounter (Signed)
I called [employer] and they verified they had already received the NPI and confirmation. Kimmie Doren,RN

## 2020-08-06 ENCOUNTER — Encounter: Payer: Self-pay | Admitting: General Practice

## 2020-08-06 ENCOUNTER — Telehealth: Payer: Self-pay | Admitting: General Practice

## 2020-08-06 NOTE — Telephone Encounter (Signed)
Called patient concerning horizon test results, no answer- left message to call us back concerning results. Per chart review, patient has appt on Friday- can review with patient at that time.

## 2020-08-10 ENCOUNTER — Encounter: Payer: Self-pay | Admitting: Medical

## 2020-08-10 ENCOUNTER — Telehealth (INDEPENDENT_AMBULATORY_CARE_PROVIDER_SITE_OTHER): Payer: Self-pay | Admitting: Medical

## 2020-08-10 NOTE — Progress Notes (Signed)
9:09a-Called Pt for My Chart visit, wants to reschedule due to stomach virus.

## 2020-08-10 NOTE — Patient Instructions (Signed)
Please reschedule as soon as possible  

## 2020-08-13 ENCOUNTER — Telehealth: Payer: Self-pay | Admitting: General Practice

## 2020-08-13 ENCOUNTER — Encounter: Payer: Self-pay | Admitting: Nurse Practitioner

## 2020-08-13 ENCOUNTER — Telehealth (INDEPENDENT_AMBULATORY_CARE_PROVIDER_SITE_OTHER): Payer: Self-pay | Admitting: Nurse Practitioner

## 2020-08-13 DIAGNOSIS — Z3A15 15 weeks gestation of pregnancy: Secondary | ICD-10-CM

## 2020-08-13 DIAGNOSIS — D563 Thalassemia minor: Secondary | ICD-10-CM | POA: Insufficient documentation

## 2020-08-13 DIAGNOSIS — J45909 Unspecified asthma, uncomplicated: Secondary | ICD-10-CM

## 2020-08-13 DIAGNOSIS — O99512 Diseases of the respiratory system complicating pregnancy, second trimester: Secondary | ICD-10-CM

## 2020-08-13 DIAGNOSIS — Z3401 Encounter for supervision of normal first pregnancy, first trimester: Secondary | ICD-10-CM

## 2020-08-13 DIAGNOSIS — O09899 Supervision of other high risk pregnancies, unspecified trimester: Secondary | ICD-10-CM | POA: Insufficient documentation

## 2020-08-13 NOTE — Telephone Encounter (Signed)
Called patient & discussed checking her email for babys[employer]s registration email. Patient states she found it and has completed registration. Discussed she come by office to pick up BP cuff as pregnancy medicaid will not cover cost of BP cuff anymore. Patient verbalized understanding. Informed patient of horizon test results, provided [employer] phone number to set up genetic information session & discussed partner testing. Told patient she can drop by the office to pick up kit for her partner. Patient verbalized understanding to all & had no questions.

## 2020-08-13 NOTE — Progress Notes (Signed)
   OBSTETRICS PRENATAL VIRTUAL VISIT ENCOUNTER NOTE  Provider location: Center for [facility]   I connected with Lum Babe on 08/13/20 at  8:35 AM EST by MyChart Video Encounter at home and verified that I am speaking with the correct person using two identifiers.   I discussed the limitations, risks, security and privacy concerns of performing an evaluation and management service virtually and the availability of in person appointments. I also discussed with the patient that there may be a patient responsible charge related to this service. The patient expressed understanding and agreed to proceed. Subjective:  Deanna Castillo is a 30 y.o. G3P2002 at [redacted]w[redacted]d being seen today for ongoing prenatal care.  She is currently monitored for the following issues for this low-risk pregnancy and has Asthma, chronic and Supervision of low-risk first pregnancy on their problem list.  Patient reports no complaints.   .  .   . Denies any leaking of fluid.   The following portions of the patient's history were reviewed and updated as appropriate: allergies, current medications, past family history, past medical history, past social history, past surgical history and problem list.   Objective:  There were no vitals filed for this visit.  Fetal Status:           General:  Alert, oriented and cooperative. Patient is in no acute distress.  Respiratory: Normal respiratory effort, no problems with respiration noted  Mental Status: Normal mood and affect. Normal behavior. Normal judgment and thought content.  Rest of physical exam deferred due to type of encounter  Imaging: No results found.  Assessment and Plan:  Pregnancy: G3P2002 at [redacted]w[redacted]d 1. Encounter for supervision of low-risk first pregnancy in first trimester Reviewed upcoming [place] appointment and time Does not have BP cuff Still has some nausea but is doing better    Preterm labor symptoms and general obstetric  precautions including but not limited to vaginal bleeding, contractions, leaking of fluid and fetal movement were reviewed in detail with the patient. I discussed the assessment and treatment plan with the patient. The patient was provided an opportunity to ask questions and all were answered. The patient agreed with the plan and demonstrated an understanding of the instructions. The patient was advised to call back or seek an in-person office evaluation/go to [facility] at [facility] for any urgent or concerning symptoms. Please refer to After Visit Summary for other counseling recommendations.   I provided 6 minutes of face-to-face time during this encounter.  Return in about 4 weeks (around 09/10/2020) for in person ROB.  Future Appointments  Date Time Provider Department Center  09/17/2020  9:00 AM [facility] US1 [facility]    Currie Paris, NP Center for [employer], [facility]

## 2020-08-13 NOTE — Patient Instructions (Signed)

## 2020-08-13 NOTE — Progress Notes (Signed)
840-- attempted to call patient for virtual visit today, no answer. Will try again soon.

## 2020-08-20 ENCOUNTER — Other Ambulatory Visit: Payer: Self-pay

## 2020-08-20 ENCOUNTER — Ambulatory Visit (INDEPENDENT_AMBULATORY_CARE_PROVIDER_SITE_OTHER): Payer: Self-pay

## 2020-08-20 DIAGNOSIS — Z3402 Encounter for supervision of normal first pregnancy, second trimester: Secondary | ICD-10-CM

## 2020-08-20 NOTE — Progress Notes (Signed)
Pt here today to pick up BP kit and partner genetic testing kit. See previous note from C. Sharlene Dory, RN on 08/13/20.  Pt given BP cuff kit and shown how to use. Pt demostrates correctly and all questions answered. Pt already has [employer] app and reminded pt to put in BP once weekly. Pt agreeable.  Pt given genetic screening kit for partner. All questions answered and advised to call [employer] rep with any questions. Pt agreeable and verbalized understanding.   Pt has next appt for ROB on 09/10/20. Pt aware.   Colletta Maryland, RN 08/20/20.

## 2020-08-20 NOTE — Progress Notes (Signed)
Agree with A & P. 

## 2020-09-10 ENCOUNTER — Encounter: Payer: Self-pay | Admitting: Nurse Practitioner

## 2020-09-10 ENCOUNTER — Other Ambulatory Visit: Payer: Self-pay

## 2020-09-10 ENCOUNTER — Ambulatory Visit (INDEPENDENT_AMBULATORY_CARE_PROVIDER_SITE_OTHER): Payer: Medicaid Other | Admitting: Nurse Practitioner

## 2020-09-10 ENCOUNTER — Encounter: Payer: Self-pay | Admitting: *Deleted

## 2020-09-10 VITALS — BP 127/78 | HR 90 | Wt 146.0 lb

## 2020-09-10 DIAGNOSIS — Z3A19 19 weeks gestation of pregnancy: Secondary | ICD-10-CM

## 2020-09-10 DIAGNOSIS — Z3402 Encounter for supervision of normal first pregnancy, second trimester: Secondary | ICD-10-CM | POA: Diagnosis not present

## 2020-09-10 LAB — POCT URINALYSIS DIP (DEVICE)
Bilirubin Urine: NEGATIVE
Glucose, UA: NEGATIVE mg/dL
Hgb urine dipstick: NEGATIVE
Ketones, ur: NEGATIVE mg/dL
Leukocytes,Ua: NEGATIVE
Nitrite: NEGATIVE
Protein, ur: NEGATIVE mg/dL
Specific Gravity, Urine: 1.025 (ref 1.005–1.030)
Urobilinogen, UA: 0.2 mg/dL (ref 0.0–1.0)
pH: 7 (ref 5.0–8.0)

## 2020-09-10 NOTE — Progress Notes (Signed)
Pt states painful urination, no blood.

## 2020-09-10 NOTE — Progress Notes (Signed)
    Subjective:  Deanna Castillo is a 30 y.o. G3P2002 at [redacted]w[redacted]d being seen today for ongoing prenatal care.  She is currently monitored for the following issues for this low-risk pregnancy and has Asthma, chronic; Supervision of low-risk first pregnancy; Alpha thalassemia silent carrier; and Cystic fibrosis carrier, antepartum on their problem list.  Patient reports some burning with urination.  Contractions: Not present. Vag. Bleeding: None.  Movement: Present. Denies leaking of fluid.   The following portions of the patient's history were reviewed and updated as appropriate: allergies, current medications, past family history, past medical history, past social history, past surgical history and problem list. Problem list updated.  Objective:   Vitals:   09/10/20 0954  BP: 127/78  Pulse: 90  Weight: 146 lb (66.2 kg)    Fetal Status: Fetal Heart Rate (bpm): 158 Fundal Height: 23 cm Movement: Present     General:  Alert, oriented and cooperative. Patient is in no acute distress.  Skin: Skin is warm and dry. No rash noted.   Cardiovascular: Normal heart rate noted  Respiratory: Normal respiratory effort, no problems with respiration noted  Abdomen: Soft, gravid, appropriate for gestational age. Pain/Pressure: Present     Pelvic:  Cervical exam deferred        Extremities: Normal range of motion.  Edema: None  Mental Status: Normal mood and affect. Normal behavior. Normal judgment and thought content.   Urinalysis:      Assessment and Plan:  Pregnancy: G3P2002 at [redacted]w[redacted]d  1. Encounter for supervision of low-risk first pregnancy in second trimester Dipstick urine done - no infection Advised - Drink at least 8 8-oz glasses of water every day.  - AFP, Serum, Open Spina Bifida   Preterm  labor symptoms and general obstetric precautions including but not limited to vaginal bleeding, contractions, leaking of fluid and fetal movement were reviewed in detail with the patient. Please refer  to After Visit Summary for other counseling recommendations.  Return in about 4 weeks (around 10/08/2020) for virtual ROB.  Nolene Bernheim, RN, MSN, NP-BC Nurse Practitioner, [facility] for [employer], [facility] 09/10/2020 10:19 AM

## 2020-09-17 ENCOUNTER — Other Ambulatory Visit: Payer: Self-pay | Admitting: *Deleted

## 2020-09-17 ENCOUNTER — Ambulatory Visit: Payer: Medicaid Other | Attending: Obstetrics and Gynecology

## 2020-09-17 ENCOUNTER — Other Ambulatory Visit: Payer: Self-pay

## 2020-09-17 ENCOUNTER — Encounter: Payer: Self-pay | Admitting: *Deleted

## 2020-09-17 ENCOUNTER — Ambulatory Visit: Payer: Medicaid Other | Admitting: *Deleted

## 2020-09-17 VITALS — BP 117/69 | HR 91

## 2020-09-17 DIAGNOSIS — Z3689 Encounter for other specified antenatal screening: Secondary | ICD-10-CM

## 2020-09-17 DIAGNOSIS — R6252 Short stature (child): Secondary | ICD-10-CM

## 2020-09-17 DIAGNOSIS — Z3401 Encounter for supervision of normal first pregnancy, first trimester: Secondary | ICD-10-CM | POA: Diagnosis not present

## 2020-09-17 LAB — AFP, SERUM, OPEN SPINA BIFIDA
AFP MoM: 1.65
AFP Value: 92.4 ng/mL
Gest. Age on Collection Date: 19 weeks
Maternal Age At EDD: 29.2 yr
OSBR Risk 1 IN: 3696
Test Results:: NEGATIVE
Weight: 146 [lb_av]

## 2020-10-08 ENCOUNTER — Telehealth: Payer: Medicaid Other | Admitting: Nurse Practitioner

## 2020-10-09 ENCOUNTER — Telehealth: Payer: Self-pay

## 2020-10-09 ENCOUNTER — Telehealth: Payer: Medicaid Other | Admitting: Family Medicine

## 2020-10-09 DIAGNOSIS — Z3402 Encounter for supervision of normal first pregnancy, second trimester: Secondary | ICD-10-CM

## 2020-10-09 NOTE — Telephone Encounter (Signed)
Called patient to begin MyChart visit and patient stated that she does not feel well and would like to reschedule

## 2020-10-10 NOTE — Progress Notes (Signed)
Patient rescheduled for a different date

## 2020-10-15 ENCOUNTER — Ambulatory Visit: Payer: Medicaid Other

## 2020-11-15 ENCOUNTER — Ambulatory Visit: Payer: Medicaid Other | Admitting: *Deleted

## 2020-11-15 ENCOUNTER — Encounter: Payer: Self-pay | Admitting: *Deleted

## 2020-11-15 ENCOUNTER — Other Ambulatory Visit: Payer: Self-pay

## 2020-11-15 ENCOUNTER — Ambulatory Visit: Payer: Medicaid Other | Attending: Obstetrics and Gynecology

## 2020-11-15 DIAGNOSIS — Z3402 Encounter for supervision of normal first pregnancy, second trimester: Secondary | ICD-10-CM | POA: Diagnosis present

## 2020-11-15 DIAGNOSIS — O358XX0 Maternal care for other (suspected) fetal abnormality and damage, not applicable or unspecified: Secondary | ICD-10-CM

## 2020-11-15 DIAGNOSIS — R6252 Short stature (child): Secondary | ICD-10-CM | POA: Insufficient documentation

## 2020-11-15 DIAGNOSIS — Z3A31 31 weeks gestation of pregnancy: Secondary | ICD-10-CM

## 2020-11-15 DIAGNOSIS — Z148 Genetic carrier of other disease: Secondary | ICD-10-CM

## 2020-11-15 DIAGNOSIS — J45909 Unspecified asthma, uncomplicated: Secondary | ICD-10-CM | POA: Diagnosis not present

## 2020-11-15 DIAGNOSIS — O99513 Diseases of the respiratory system complicating pregnancy, third trimester: Secondary | ICD-10-CM | POA: Diagnosis not present

## 2020-12-31 ENCOUNTER — Ambulatory Visit (INDEPENDENT_AMBULATORY_CARE_PROVIDER_SITE_OTHER): Payer: Medicaid Other | Admitting: Student

## 2020-12-31 ENCOUNTER — Other Ambulatory Visit (HOSPITAL_COMMUNITY)
Admission: RE | Admit: 2020-12-31 | Discharge: 2020-12-31 | Disposition: A | Discharge status: Home / Self Care | Payer: Medicaid Other | Source: Ambulatory Visit | Attending: Student | Admitting: Student

## 2020-12-31 ENCOUNTER — Other Ambulatory Visit: Payer: Self-pay

## 2020-12-31 VITALS — BP 119/71 | HR 92 | Wt 159.3 lb

## 2020-12-31 DIAGNOSIS — Z3A36 36 weeks gestation of pregnancy: Secondary | ICD-10-CM | POA: Insufficient documentation

## 2020-12-31 DIAGNOSIS — Z3403 Encounter for supervision of normal first pregnancy, third trimester: Secondary | ICD-10-CM | POA: Insufficient documentation

## 2020-12-31 DIAGNOSIS — Z3402 Encounter for supervision of normal first pregnancy, second trimester: Secondary | ICD-10-CM

## 2020-12-31 NOTE — Progress Notes (Addendum)
   PRENATAL VISIT NOTE  Subjective:  Deanna Castillo is a 30 y.o. G3P2002 at [redacted]w[redacted]d being seen today for ongoing prenatal care.  She is currently monitored for the following issues for this low-risk pregnancy and has Asthma, chronic; Supervision of low-risk first pregnancy; Alpha thalassemia silent carrier; and Cystic fibrosis carrier, antepartum on their problem list.  Patient reports no complaints.  Contractions: Irritability. Vag. Bleeding: None.  Movement: Present. Denies leaking of fluid.   The following portions of the patient's history were reviewed and updated as appropriate: allergies, current medications, past family history, past medical history, past social history, past surgical history and problem list.   Objective:   Vitals:   12/31/20 1122  BP: 119/71  Pulse: 92  Weight: 159 lb 4.8 oz (72.3 kg)    Fetal Status: Fetal Heart Rate (bpm): 130 Fundal Height: 38 cm Movement: Present     General:  Alert, oriented and cooperative. Patient is in no acute distress.  Skin: Skin is warm and dry. No rash noted.   Cardiovascular: Normal heart rate noted  Respiratory: Normal respiratory effort, no problems with respiration noted  Abdomen: Soft, gravid, appropriate for gestational age.  Pain/Pressure: Present     Pelvic: Cervical exam performed in the presence of a chaperone Dilation: Fingertip Effacement (%): 50 Station: Ballotable  Extremities: Normal range of motion.  Edema: None  Mental Status: Normal mood and affect. Normal behavior. Normal judgment and thought content.   Assessment and Plan:  Pregnancy: G3P2002 at [redacted]w[redacted]d 1. [redacted] weeks gestation of pregnancy -discussed birth control, patient is interested in Nexplanon. She does not want IUD, pills.  - Culture, beta strep (group b only) - GC/Chlamydia probe amp ([place])not at [facility] -patinet has not had 2 hour GTT; she will come back tomorrow . Note made in check out papers 2. Encounter for supervision of low-risk first  pregnancy in second trimester -vertex by [place] -discussed signs of labor, including 5-1-1 labor rule  Term labor symptoms and general obstetric precautions including but not limited to vaginal bleeding, contractions, leaking of fluid and fetal movement were reviewed in detail with the patient. Please refer to After Visit Summary for other counseling recommendations.   Return in about 1 week (around 01/07/2021), or LROB in person and please schedule for 28 weeks labs tomorrow.  No future appointments.  Marylene Land, CNM

## 2020-12-31 NOTE — Progress Notes (Signed)
Patient complains of daily "numbing" pain in both of her legs and thighs. Denies any vaginal bleeding and stated that baby moves daily

## 2021-01-01 ENCOUNTER — Other Ambulatory Visit: Payer: Medicaid Other

## 2021-01-01 DIAGNOSIS — Z3A36 36 weeks gestation of pregnancy: Secondary | ICD-10-CM

## 2021-01-01 LAB — GC/CHLAMYDIA PROBE AMP (~~LOC~~) NOT AT ARMC
Chlamydia: NEGATIVE
Comment: NEGATIVE
Comment: NORMAL
Neisseria Gonorrhea: NEGATIVE

## 2021-01-02 LAB — CBC
Hematocrit: 33.6 % — ABNORMAL LOW (ref 34.0–46.6)
Hemoglobin: 10.9 g/dL — ABNORMAL LOW (ref 11.1–15.9)
MCH: 27.7 pg (ref 26.6–33.0)
MCHC: 32.4 g/dL (ref 31.5–35.7)
MCV: 86 fL (ref 79–97)
Platelets: 232 10*3/uL (ref 150–450)
RBC: 3.93 x10E6/uL (ref 3.77–5.28)
RDW: 13.4 % (ref 11.7–15.4)
WBC: 10.2 10*3/uL (ref 3.4–10.8)

## 2021-01-02 LAB — GLUCOSE TOLERANCE, 2 HOURS W/ 1HR
Glucose, 1 hour: 146 mg/dL (ref 65–179)
Glucose, 2 hour: 118 mg/dL (ref 65–152)
Glucose, Fasting: 82 mg/dL (ref 65–91)

## 2021-01-02 LAB — HIV ANTIBODY (ROUTINE TESTING W REFLEX): HIV Screen 4th Generation wRfx: NONREACTIVE

## 2021-01-02 LAB — RPR: RPR Ser Ql: NONREACTIVE

## 2021-01-03 LAB — CULTURE, BETA STREP (GROUP B ONLY): Strep Gp B Culture: POSITIVE — AB

## 2021-01-04 ENCOUNTER — Inpatient Hospital Stay (HOSPITAL_COMMUNITY)
Admission: RE | Admit: 2021-01-04 | Discharge: 2021-01-06 | DRG: 807 | Disposition: A | Discharge status: Home / Self Care | Payer: Medicaid Other | Attending: Obstetrics & Gynecology | Admitting: Obstetrics & Gynecology

## 2021-01-04 ENCOUNTER — Encounter (HOSPITAL_COMMUNITY): Payer: Self-pay | Admitting: Family Medicine

## 2021-01-04 DIAGNOSIS — O323XX0 Maternal care for face, brow and chin presentation, not applicable or unspecified: Principal | ICD-10-CM | POA: Diagnosis present

## 2021-01-04 DIAGNOSIS — O99824 Streptococcus B carrier state complicating childbirth: Secondary | ICD-10-CM | POA: Diagnosis present

## 2021-01-04 DIAGNOSIS — Z20822 Contact with and (suspected) exposure to covid-19: Secondary | ICD-10-CM | POA: Diagnosis present

## 2021-01-04 DIAGNOSIS — O9902 Anemia complicating childbirth: Secondary | ICD-10-CM | POA: Diagnosis present

## 2021-01-04 DIAGNOSIS — Z23 Encounter for immunization: Secondary | ICD-10-CM

## 2021-01-04 DIAGNOSIS — Z3A38 38 weeks gestation of pregnancy: Secondary | ICD-10-CM

## 2021-01-04 NOTE — MAU Note (Signed)
Ctxs started at 12n and irreg. Stronger and closer since 1800. Denies LOF or VB. Cervix closed this past Monday

## 2021-01-05 ENCOUNTER — Encounter (HOSPITAL_COMMUNITY): Payer: Self-pay | Admitting: Family Medicine

## 2021-01-05 ENCOUNTER — Inpatient Hospital Stay (HOSPITAL_COMMUNITY): Payer: Medicaid Other | Admitting: Anesthesiology

## 2021-01-05 DIAGNOSIS — D649 Anemia, unspecified: Secondary | ICD-10-CM | POA: Diagnosis not present

## 2021-01-05 DIAGNOSIS — Z23 Encounter for immunization: Secondary | ICD-10-CM | POA: Diagnosis not present

## 2021-01-05 DIAGNOSIS — O26893 Other specified pregnancy related conditions, third trimester: Secondary | ICD-10-CM | POA: Diagnosis not present

## 2021-01-05 DIAGNOSIS — Z20822 Contact with and (suspected) exposure to covid-19: Secondary | ICD-10-CM | POA: Diagnosis not present

## 2021-01-05 DIAGNOSIS — O99820 Streptococcus B carrier state complicating pregnancy: Secondary | ICD-10-CM | POA: Diagnosis not present

## 2021-01-05 DIAGNOSIS — Z3A38 38 weeks gestation of pregnancy: Secondary | ICD-10-CM | POA: Diagnosis not present

## 2021-01-05 DIAGNOSIS — O99824 Streptococcus B carrier state complicating childbirth: Secondary | ICD-10-CM | POA: Diagnosis not present

## 2021-01-05 DIAGNOSIS — O323XX0 Maternal care for face, brow and chin presentation, not applicable or unspecified: Secondary | ICD-10-CM | POA: Diagnosis not present

## 2021-01-05 DIAGNOSIS — O9902 Anemia complicating childbirth: Secondary | ICD-10-CM | POA: Diagnosis not present

## 2021-01-05 LAB — CBC
HCT: 32.2 % — ABNORMAL LOW (ref 36.0–46.0)
Hemoglobin: 10.7 g/dL — ABNORMAL LOW (ref 12.0–15.0)
MCH: 28.2 pg (ref 26.0–34.0)
MCHC: 33.2 g/dL (ref 30.0–36.0)
MCV: 85 fL (ref 80.0–100.0)
Platelets: 242 10*3/uL (ref 150–400)
RBC: 3.79 MIL/uL — ABNORMAL LOW (ref 3.87–5.11)
RDW: 13.9 % (ref 11.5–15.5)
WBC: 11.6 10*3/uL — ABNORMAL HIGH (ref 4.0–10.5)
nRBC: 0 % (ref 0.0–0.2)

## 2021-01-05 LAB — RPR: RPR Ser Ql: NONREACTIVE

## 2021-01-05 LAB — TYPE AND SCREEN
ABO/RH(D): O POS
Antibody Screen: NEGATIVE

## 2021-01-05 LAB — RESP PANEL BY RT-PCR (FLU A&B, COVID) ARPGX2
Influenza A by PCR: NEGATIVE
Influenza B by PCR: NEGATIVE
SARS Coronavirus 2 by RT PCR: NEGATIVE

## 2021-01-05 MED ORDER — SIMETHICONE 80 MG PO CHEW: 80.0000 mg | CHEWABLE_TABLET | ORAL | Status: DC | PRN

## 2021-01-05 MED ORDER — SENNOSIDES-DOCUSATE SODIUM 8.6-50 MG PO TABS
2.0000 | ORAL_TABLET | ORAL | Status: DC
  Administered 2021-01-06: 2 via ORAL
  Filled 2021-01-05 (×2): qty 2

## 2021-01-05 MED ORDER — DIBUCAINE (PERIANAL) 1 % EX OINT: 1.0000 "application " | TOPICAL_OINTMENT | CUTANEOUS | Status: DC | PRN

## 2021-01-05 MED ORDER — LACTATED RINGERS IV SOLN: INTRAVENOUS | Status: DC

## 2021-01-05 MED ORDER — LACTATED RINGERS IV SOLN: 500.0000 mL | INTRAVENOUS | Status: DC | PRN

## 2021-01-05 MED ORDER — BENZOCAINE-MENTHOL 20-0.5 % EX AERO
1.0000 "application " | INHALATION_SPRAY | CUTANEOUS | Status: DC | PRN
  Administered 2021-01-05: 1 via TOPICAL
  Filled 2021-01-05: qty 56

## 2021-01-05 MED ORDER — ACETAMINOPHEN 325 MG PO TABS: 650.0000 mg | ORAL_TABLET | ORAL | Status: DC | PRN

## 2021-01-05 MED ORDER — OXYTOCIN-SODIUM CHLORIDE 30-0.9 UT/500ML-% IV SOLN
2.5000 [IU]/h | INTRAVENOUS | Status: DC
  Filled 2021-01-05: qty 500

## 2021-01-05 MED ORDER — OXYCODONE-ACETAMINOPHEN 5-325 MG PO TABS: 1.0000 | ORAL_TABLET | ORAL | Status: DC | PRN

## 2021-01-05 MED ORDER — TETANUS-DIPHTH-ACELL PERTUSSIS 5-2.5-18.5 LF-MCG/0.5 IM SUSY
0.5000 mL | PREFILLED_SYRINGE | Freq: Once | INTRAMUSCULAR | Status: AC
  Administered 2021-01-06: 0.5 mL via INTRAMUSCULAR
  Filled 2021-01-05: qty 0.5

## 2021-01-05 MED ORDER — ACETAMINOPHEN 325 MG PO TABS
650.0000 mg | ORAL_TABLET | ORAL | Status: DC | PRN
  Administered 2021-01-05: 650 mg via ORAL
  Filled 2021-01-05: qty 2

## 2021-01-05 MED ORDER — PHENYLEPHRINE 40 MCG/ML (10ML) SYRINGE FOR IV PUSH (FOR BLOOD PRESSURE SUPPORT): 80.0000 ug | PREFILLED_SYRINGE | INTRAVENOUS | Status: DC | PRN

## 2021-01-05 MED ORDER — OXYCODONE-ACETAMINOPHEN 5-325 MG PO TABS: 2.0000 | ORAL_TABLET | ORAL | Status: DC | PRN

## 2021-01-05 MED ORDER — OXYTOCIN BOLUS FROM INFUSION
333.0000 mL | Freq: Once | INTRAVENOUS | Status: AC
  Administered 2021-01-05: 333 mL via INTRAVENOUS

## 2021-01-05 MED ORDER — SODIUM CHLORIDE 0.9 % IV SOLN: 2.0000 g | Freq: Four times a day (QID) | INTRAVENOUS | Status: DC

## 2021-01-05 MED ORDER — MEASLES, MUMPS & RUBELLA VAC IJ SOLR: 0.5000 mL | Freq: Once | INTRAMUSCULAR | Status: DC

## 2021-01-05 MED ORDER — DOCUSATE SODIUM 100 MG PO CAPS
100.0000 mg | ORAL_CAPSULE | Freq: Two times a day (BID) | ORAL | Status: DC
  Administered 2021-01-05 – 2021-01-06 (×2): 100 mg via ORAL
  Filled 2021-01-05 (×2): qty 1

## 2021-01-05 MED ORDER — ONDANSETRON HCL 4 MG PO TABS: 4.0000 mg | ORAL_TABLET | ORAL | Status: DC | PRN

## 2021-01-05 MED ORDER — LACTATED RINGERS IV SOLN: 500.0000 mL | Freq: Once | INTRAVENOUS | Status: DC

## 2021-01-05 MED ORDER — EPHEDRINE 5 MG/ML INJ: 10.0000 mg | INTRAVENOUS | Status: DC | PRN

## 2021-01-05 MED ORDER — DIPHENHYDRAMINE HCL 50 MG/ML IJ SOLN: 12.5000 mg | INTRAMUSCULAR | Status: DC | PRN

## 2021-01-05 MED ORDER — SODIUM CHLORIDE 0.9 % IV SOLN
5.0000 10*6.[IU] | Freq: Once | INTRAVENOUS | Status: AC
  Administered 2021-01-05: 5 10*6.[IU] via INTRAVENOUS
  Filled 2021-01-05: qty 5

## 2021-01-05 MED ORDER — FENTANYL-BUPIVACAINE-NACL 0.5-0.125-0.9 MG/250ML-% EP SOLN
12.0000 mL/h | EPIDURAL | Status: DC | PRN
Start: 2021-01-05 — End: 2021-01-05
  Administered 2021-01-05: 12 mL/h via EPIDURAL
  Filled 2021-01-05: qty 250

## 2021-01-05 MED ORDER — SODIUM CHLORIDE 0.9 % IV SOLN
2.0000 g | Freq: Once | INTRAVENOUS | Status: AC
  Administered 2021-01-05: 2 g via INTRAVENOUS
  Filled 2021-01-05: qty 2000

## 2021-01-05 MED ORDER — LIDOCAINE HCL (PF) 1 % IJ SOLN: 30.0000 mL | INTRAMUSCULAR | Status: DC | PRN

## 2021-01-05 MED ORDER — ONDANSETRON HCL 4 MG/2ML IJ SOLN
4.0000 mg | Freq: Four times a day (QID) | INTRAMUSCULAR | Status: DC | PRN
  Administered 2021-01-05: 4 mg via INTRAVENOUS
  Filled 2021-01-05: qty 2

## 2021-01-05 MED ORDER — PRENATAL MULTIVITAMIN CH
1.0000 | ORAL_TABLET | Freq: Every day | ORAL | Status: DC
  Administered 2021-01-05 – 2021-01-06 (×2): 1 via ORAL
  Filled 2021-01-05 (×2): qty 1

## 2021-01-05 MED ORDER — ONDANSETRON HCL 4 MG/2ML IJ SOLN
4.0000 mg | INTRAMUSCULAR | Status: DC | PRN
Start: 2021-01-05 — End: 2021-01-06

## 2021-01-05 MED ORDER — DIPHENHYDRAMINE HCL 25 MG PO CAPS: 25.0000 mg | ORAL_CAPSULE | Freq: Four times a day (QID) | ORAL | Status: DC | PRN

## 2021-01-05 MED ORDER — WITCH HAZEL-GLYCERIN EX PADS: 1.0000 "application " | MEDICATED_PAD | CUTANEOUS | Status: DC | PRN

## 2021-01-05 MED ORDER — COCONUT OIL OIL: 1.0000 "application " | TOPICAL_OIL | Status: DC | PRN

## 2021-01-05 MED ORDER — SODIUM CHLORIDE 0.9 % IV SOLN: 1.0000 g | INTRAVENOUS | Status: DC

## 2021-01-05 MED ORDER — LIDOCAINE HCL (PF) 1 % IJ SOLN
INTRAMUSCULAR | Status: DC | PRN
  Administered 2021-01-05: 11 mL via EPIDURAL

## 2021-01-05 MED ORDER — SOD CITRATE-CITRIC ACID 500-334 MG/5ML PO SOLN: 30.0000 mL | ORAL | Status: DC | PRN

## 2021-01-05 MED ORDER — IBUPROFEN 600 MG PO TABS
600.0000 mg | ORAL_TABLET | Freq: Four times a day (QID) | ORAL | Status: DC
  Administered 2021-01-05 – 2021-01-06 (×4): 600 mg via ORAL
  Filled 2021-01-05 (×4): qty 1

## 2021-01-05 MED ORDER — PENICILLIN G POT IN DEXTROSE 60000 UNIT/ML IV SOLN: 3.0000 10*6.[IU] | INTRAVENOUS | Status: DC

## 2021-01-05 NOTE — H&P (Signed)
  Deanna Castillo is an 30 y.o. [id number] [redacted]w[redacted]d female.   Chief Complaint: here with contractions HPI: on exam noted to have an abnormal presentation with probable face/brow presenting. Labor began at 6 pm.  Prenatal care at [facility].  Past Medical History:  Diagnosis Date   Asthma    Normal labor 08/04/2016   Poor weight gain of pregnancy, third trimester 07/31/2016   Lost weight initially, but gaining well third trimester. Watch fundal height. Growth [place] PRN.     Past Surgical History:  Procedure Laterality Date   NO PAST SURGERIES      Family History  Problem Relation Age of Onset   Lung cancer Maternal Grandmother    Hypertension Mother    Diabetes Maternal Aunt    Social History:  reports that she has never smoked. She has never used smokeless tobacco. She reports previous alcohol use. She reports previous drug use. Drug: Marijuana.    Allergies  Allergen Reactions   Other Anaphylaxis    Cat dander-- "respiratory distress"    Medications Prior to Admission  Medication Sig Dispense Refill   prenatal vitamin w/FE, FA (PRENATAL 1 + 1) 27-1 MG TABS tablet Take 1 tablet by mouth daily at 12 noon. 30 tablet 11   promethazine (PHENERGAN) 25 MG tablet Take 1 tablet (25 mg total) by mouth every 6 (six) hours as needed for nausea or vomiting. (Patient not taking: No sig reported) 30 tablet 0     A comprehensive review of systems was negative.  Blood pressure 116/67, pulse 97, temperature 98.1 F (36.7 C), resp. rate 18, height 5\' 4"  (1.626 m), weight 71.7 kg, last menstrual period 04/30/2020, currently breastfeeding. BP 116/67   Pulse 97   Temp 98.1 F (36.7 C)   Resp 18   Ht 5\' 4"  (1.626 m)   Wt 71.7 kg   LMP 04/30/2020   BMI 27.12 kg/m  General appearance: alert, cooperative, and appears stated age Head: Normocephalic, without obvious abnormality, atraumatic Neck: supple, symmetrical, trachea midline Lungs:  normal effort Heart: regular rate and rhythm Abdomen:  gravid,  non-tender Extremities: Homans sign is negative, no sign of DVT Skin: Skin color, texture, turgor normal. No rashes or lesions Neurologic: Grossly normal   Lab Results  Component Value Date   WBC 10.2 01/01/2021   HGB 10.9 (L) 01/01/2021   HCT 33.6 (L) 01/01/2021   MCV 86 01/01/2021   PLT 232 01/01/2021           ABO, Rh: O/Positive/-- (01/05 0959)  Antibody: Negative (01/05 0959)  Rubella: 4.59 (01/05 0959)  RPR: Non Reactive (06/28 0826)  HBsAg: Negative (01/05 0959)  HIV: Non Reactive (06/28 0826)  GBS: Positive/-- (06/27 1155)     Prenatal Transfer Tool  Maternal Diabetes: No Genetic Screening: Normal Maternal Ultrasounds/Referrals: Normal Fetal Ultrasounds or other Referrals:  None Maternal Substance Abuse:  No Significant Maternal Medications:  None Significant Maternal Lab Results: Group B Strep positive   Assessment/Plan Labor abnormal  Brow presentation of fetus, single or unspecified fetus     #Labor: abnormal presentation, will get epidural and see if baby changes position or if can be easily moved--if not will need primary Cesarean section #Pain: Epidural #FWB: Category 1 #ID: GBS positive, will need antibiotics #MOF: Breast #MOC: Nexplanon, IP #Circ:  female #Anemia: n/a  12-30-1974 01/05/2021, 12:19 AM

## 2021-01-05 NOTE — Discharge Summary (Signed)
Postpartum Discharge Summary      Patient Name: Deanna Castillo DOB: Jan 11, 1991 MRN: [id number]  Date of admission: 01/04/2021 Delivery date:01/05/2021  Delivering provider: Janet Berlin  Date of discharge: 01/06/2021  Admitting diagnosis: Indication for care in labor or delivery [O75.9] Intrauterine pregnancy: [redacted]w[redacted]d    Secondary diagnosis:  Active Problems:   Vaginal delivery   Indication for care in labor or delivery  Additional problems: None    Discharge diagnosis: Term Pregnancy Delivered                                              Post partum procedures: None Augmentation: N/A Complications: None  Hospital course: Onset of Labor With Vaginal Delivery      30y.o. yo G3P2002 at 338w2das admitted in Active Labor on 01/04/2021. Patient had a labor course complicated by face/brow presentation. She had an routine labor.  Membrane Rupture Time/Date: 8:05 AM ,01/05/2021   Delivery Method:Vaginal, Spontaneous  Episiotomy: None  Lacerations:  None  Patient had an uncomplicated postpartum course.  She is ambulating, tolerating a regular diet, passing flatus, and urinating well. Patient is discharged home in stable condition on 01/06/21.  Newborn Data: Birth date:01/05/2021  Birth time:8:51 AM  Gender:Female  Living status:Living  Apgars:8 ,9  Weight:2611 g   Magnesium Sulfate received: No BMZ received: No Rhophylac:No MMR:N/A T-DaP:Given postpartum Flu: No Transfusion:No  Physical exam  Vitals:   01/05/21 1625 01/05/21 2200 01/06/21 0024 01/06/21 0607  BP: 116/82 115/79 125/85 114/81  Pulse: 82 67 80 71  Resp: '16 18 18 18  ' Temp: 98.1 F (36.7 C) 97.6 F (36.4 C) 98.7 F (37.1 C) 98.6 F (37 C)  TempSrc: Oral Oral Oral Oral  SpO2: 100%     Weight:      Height:       General: alert, cooperative, and no distress Lochia: appropriate Uterine Fundus: firm Incision: N/A DVT Evaluation: No evidence of DVT seen on physical exam. Labs: Lab Results  Component  Value Date   WBC 11.6 (H) 01/05/2021   HGB 10.7 (L) 01/05/2021   HCT 32.2 (L) 01/05/2021   MCV 85.0 01/05/2021   PLT 242 01/05/2021   CMP Latest Ref Rng & Units 03/12/2013  Glucose 70 - 99 mg/dL 80  BUN 6 - 23 mg/dL 5(L)  Creatinine 0.50 - 1.10 mg/dL 0.63  Sodium 135 - 145 mEq/L 132(L)  Potassium 3.5 - 5.1 mEq/L 3.7  Chloride 96 - 112 mEq/L 98  CO2 19 - 32 mEq/L 23  Calcium 8.4 - 10.5 mg/dL 9.4  Total Protein 6.0 - 8.3 g/dL 8.0  Total Bilirubin 0.3 - 1.2 mg/dL 0.3  Alkaline Phos 39 - 117 U/L 51  AST 0 - 37 U/L 15  ALT 0 - 35 U/L 10   Edinburgh Score: Edinburgh Postnatal Depression Scale Screening Tool 01/06/2021  I have been able to laugh and see the funny side of things. (No Data)     After visit meds:  Allergies as of 01/06/2021       Reactions   Other Anaphylaxis   Cat dander-- "respiratory distress"        Medication List     STOP taking these medications    promethazine 25 MG tablet Commonly known as: PHENERGAN       TAKE these medications    ibuprofen 600  MG tablet Commonly known as: ADVIL Take 1 tablet (600 mg total) by mouth every 6 (six) hours.   prenatal vitamin w/FE, FA 27-1 MG Tabs tablet Take 1 tablet by mouth daily at 12 noon.         Discharge home in stable condition Infant Feeding: Breast Infant Disposition:home with mother Discharge instruction: per After Visit Summary and Postpartum booklet. Activity: Advance as tolerated. Pelvic rest for 6 weeks.  Diet: routine diet Future Appointments: Future Appointments  Date Time Provider [place]  01/08/2021  3:15 PM Starr Lake, CNM [facility]   Follow up Visit:  [place] [facility]. Schedule an appointment as soon as possible for a visit in 4 week(s).   Specialty: Obstetrics and Gynecology Why: For postpartum visit and as needed for pregnancy complications Contact information: [address] [place] [address] [phone]        [facility] Follow up.   Specialty: Obstetrics and Gynecology Why: As needed in emergencies Contact information: [address] [id number] [place] [phone]                 Please schedule this patient for a In person postpartum visit in 6 weeks with the following provider: Any provider. Additional Postpartum F/U: none   Low risk pregnancy complicated by:  uncomplicated Delivery mode:  Vaginal, Spontaneous  Anticipated Birth Control:  Nexplanon   01/06/2021 Manya Silvas, CNM

## 2021-01-05 NOTE — Lactation Note (Addendum)
This note was copied from a baby's chart. Lactation Consultation Note  Patient Name: Deanna Castillo Date: 01/05/2021 Reason for consult: Initial assessment;Mother's request;Difficult latch;Early term 37-38.6wks;Infant < 6lbs Age:29 hours  LC order put in late in the day.  LC alerted by RN, Pilar Jarvis, infant had a low sugar. On arrival, infant asleep not showing signs of cueing.  LC did some suck training to open her mouth wider to latch. Infant high palate and a labial attachment. She can extend her tongue pass the gum line.  Infant offered 1 ml of EBM via spoon. Following able to latch her to breast for about 6 minutes in cross cradle with signs of milk transfer. Mom offered additional 2.5 ml of EBM via spoon.   LC reviewed with parents keeping total feeding under 30 minutes to reduce calorie loss.  Plan 1. To feed by cues 8-12x in 24 hr period no more than 3 hrs without an attempt. Mom to offer both breasts and look for signs of milk transfer.  2. Mom to supplement with paced bottle feeding using extra slow flow nipple 5-10 ml after each breast feeding attempt.  3. Mom to pump with dEBp q 3 hrs for 15 min 4. I and O sheet reviewed.  5 LC brochure of inpatient and outpatient services reviewed.   Mom adequate colostrum to supplement after each attempt at the breast.  All questions answered at the end of the visit.   Maternal Data Has patient been taught Hand Expression?: Yes Does the patient have breastfeeding experience prior to this delivery?: No  Feeding Mother's Current Feeding Choice: Breast Milk  LATCH Score Latch: Repeated attempts needed to sustain latch, nipple held in mouth throughout feeding, stimulation needed to elicit sucking reflex.  Audible Swallowing: A few with stimulation  Type of Nipple: Everted at rest and after stimulation  Comfort (Breast/Nipple): Soft / non-tender  Hold (Positioning): Assistance needed to correctly position infant at  breast and maintain latch.  LATCH Score: 7   Lactation Tools Discussed/Used Tools: Pump;Flanges Flange Size: 24 Breast pump type: Double-Electric Breast Pump Pump Education: Setup, frequency, and cleaning;Milk Storage Reason for Pumping: increase stimulation Pumping frequency: every 3 hrs for 15 min  Interventions Interventions: Breast feeding basics reviewed;Assisted with latch;Adjust position;Breast compression;Skin to skin;Support pillows;DEBP;Position options;Breast massage;Hand express;Expressed milk;Education  Discharge WIC Program: Yes  Consult Status Consult Status: Follow-up Date: 01/06/21 Follow-up type: In-patient    Deanna Emami  Castillo 01/05/2021, 6:38 PM

## 2021-01-05 NOTE — Anesthesia Procedure Notes (Signed)
Epidural Patient location during procedure: OB Start time: 01/05/2021 1:21 AM End time: 01/05/2021 1:35 AM  Staffing Anesthesiologist: Lowella Curb, MD Performed: anesthesiologist   Preanesthetic Checklist Completed: patient identified, IV checked, site marked, risks and benefits discussed, surgical consent, monitors and equipment checked, pre-op evaluation and timeout performed  Epidural Patient position: sitting Prep: ChloraPrep Patient monitoring: heart rate, cardiac monitor, continuous pulse ox and blood pressure Approach: midline Location: L2-L3 Injection technique: LOR saline  Needle:  Needle type: Tuohy  Needle gauge: 17 G Needle length: 9 cm Needle insertion depth: 5 cm Catheter type: closed end flexible Catheter size: 20 Guage Catheter at skin depth: 9 cm Test dose: negative  Assessment Events: blood not aspirated, injection not painful, no injection resistance, no paresthesia and negative IV test  Additional Notes Reason for block:procedure for pain

## 2021-01-05 NOTE — Anesthesia Preprocedure Evaluation (Signed)
Anesthesia Evaluation  Patient identified by MRN, date of birth, ID band Patient awake    Reviewed: Allergy & Precautions, H&P , NPO status , Patient's Chart, lab work & pertinent test results  Airway Mallampati: II  TM Distance: >3 FB Neck ROM: Full    Dental no notable dental hx.    Pulmonary neg pulmonary ROS, asthma ,    Pulmonary exam normal breath sounds clear to auscultation       Cardiovascular negative cardio ROS Normal cardiovascular exam Rhythm:Regular Rate:Normal     Neuro/Psych negative neurological ROS  negative psych ROS   GI/Hepatic negative GI ROS, Neg liver ROS,   Endo/Other  negative endocrine ROS  Renal/GU negative Renal ROS     Musculoskeletal negative musculoskeletal ROS (+)   Abdominal   Peds  Hematology  (+) anemia ,   Anesthesia Other Findings   Reproductive/Obstetrics negative OB ROS                             Anesthesia Physical  Anesthesia Plan  ASA: II  Anesthesia Plan: Epidural   Post-op Pain Management:    Induction:   PONV Risk Score and Plan:   Airway Management Planned:   Additional Equipment:   Intra-op Plan:   Post-operative Plan:   Informed Consent: I have reviewed the patients History and Physical, chart, labs and discussed the procedure including the risks, benefits and alternatives for the proposed anesthesia with the patient or authorized representative who has indicated his/her understanding and acceptance.       Plan Discussed with:   Anesthesia Plan Comments:         Anesthesia Quick Evaluation

## 2021-01-06 MED ORDER — IBUPROFEN 600 MG PO TABS: 600.0000 mg | ORAL_TABLET | Freq: Four times a day (QID) | ORAL | 0 refills | Status: DC

## 2021-01-06 NOTE — Plan of Care (Signed)

## 2021-01-06 NOTE — Progress Notes (Addendum)
POSTPARTUM PROGRESS NOTE  Subjective: Deanna Castillo is a 30 y.o. G3P3003 PPD#1 s/p SVD at [redacted]w[redacted]d.  She reports she doing well. No acute events overnight. She denies any problems with ambulating, voiding or po intake. Denies nausea or vomiting. She has passed flatus. Pain is well controlled.  Lochia is appropriate.  Objective: Blood pressure 114/81, pulse 71, temperature 98.6 F (37 C), temperature source Oral, resp. rate 18, height 5\' 4"  (1.626 m), weight 71.7 kg, last menstrual period 04/30/2020, SpO2 100 %, currently breastfeeding.  Physical Exam:  General: alert, cooperative and no distress Chest: no respiratory distress Abdomen: soft, minimally tender  Uterine Fundus: firm and at level of umbilicus Extremities: No calf swelling or tenderness  No edema  Recent Labs    01/05/21 0035  HGB 10.7*  HCT 32.2*    Assessment/Plan: Deanna Castillo is a 30 y.o. G3P3003 PPD1 s/p SVD at [redacted]w[redacted]d.  Routine Postpartum Care: Doing well, pain well-controlled.  -- Continue routine care, lactation support  -- Contraception: OPs -- Feeding: breast  Dispo: Plan for discharge tomorrow due to inadequate abx given for GBS pos.  [redacted]w[redacted]d, MD, PGY1 Faculty Practice, Center for [facility] 01/06/2021 7:39 AM

## 2021-01-06 NOTE — Anesthesia Postprocedure Evaluation (Signed)
Anesthesia Post Note  Patient: Deanna Castillo  Procedure(s) Performed: AN AD HOC LABOR EPIDURAL     Patient location during evaluation: Mother Baby Anesthesia Type: Epidural Level of consciousness: awake and alert Pain management: pain level controlled Vital Signs Assessment: post-procedure vital signs reviewed and stable Respiratory status: spontaneous breathing, nonlabored ventilation and respiratory function stable Cardiovascular status: stable Postop Assessment: no headache, no backache, epidural receding, no apparent nausea or vomiting, patient able to bend at knees, adequate PO intake and able to ambulate Anesthetic complications: no   No notable events documented.  Last Vitals:  Vitals:   01/06/21 0024 01/06/21 0607  BP: 125/85 114/81  Pulse: 80 71  Resp: 18 18  Temp: 37.1 C 37 C  SpO2:      Last Pain:  Vitals:   01/06/21 0607  TempSrc: Oral  PainSc:    Pain Goal: Patients Stated Pain Goal: 0 (01/04/21 2323)                 Laban Emperor

## 2021-01-06 NOTE — Lactation Note (Signed)
This note was copied from a baby's chart. Lactation Consultation Note  Patient Name: Deanna Castillo [id number] Date: 01/06/2021 Reason for consult: Follow-up assessment;Early term 37-38.6wks;Infant < 6lbs Age:29 hours  LC in to visit with P3 Mom of ET infant with a 3% weight loss.  Mom has been latching baby to the breast and supplementing by bottle with her EBM.  Mom is pumping using the DEBP.    Waiting on serum bilirubin level to determine is baby can be discharged.   Reviewed importance of disassembling all pump parts, washing, rinsing and air drying in separate bin.   LC will f/u if baby is discharged today.  Lactation Tools Discussed/Used Tools: Pump Breast pump type: Double-Electric Breast Pump Pumping frequency: Q 3 hrs Pumped volume: 10 mL  Interventions Interventions: Breast feeding basics reviewed;Skin to skin;Breast massage;Hand express;DEBP;Education   Consult Status Consult Status: Follow-up Date: 01/07/21 Follow-up type: In-patient    [place], [place] 01/06/2021, 9:25 AM

## 2021-01-08 ENCOUNTER — Other Ambulatory Visit: Payer: Self-pay

## 2021-01-08 ENCOUNTER — Telehealth: Payer: Self-pay | Admitting: *Deleted

## 2021-01-08 ENCOUNTER — Ambulatory Visit (INDEPENDENT_AMBULATORY_CARE_PROVIDER_SITE_OTHER): Payer: Medicaid Other | Admitting: Student

## 2021-01-08 NOTE — Telephone Encounter (Signed)
Transition Care Management Follow-up Telephone Call Date of discharge and from where: 01/06/2021 - [facility] How have you been since you were released from the hospital? "Doing good" Any questions or concerns? No  Items Reviewed: Did the pt receive and understand the discharge instructions provided? Yes  Medications obtained and verified? Yes  Other? No  Any new allergies since your discharge? No  Dietary orders reviewed? No Do you have support at home? Yes    Functional Questionnaire: (I = Independent and D = Dependent) ADLs: I  Bathing/Dressing- I  Meal Prep- I  Eating- I  Maintaining continence- I  Transferring/Ambulation- I  Managing Meds- I  Follow up appointments reviewed:  PCP Hospital f/u appt confirmed? No   Specialist Hospital f/u appt confirmed? Yes  Scheduled to see OBGYN on 01/08/2021 @ 1515. Are transportation arrangements needed? No  If their condition worsens, is the pt aware to call PCP or go to the Emergency Dept.? Yes Was the patient provided with contact information for the PCP's office or ED? Yes Was to pt encouraged to call back with questions or concerns? Yes

## 2021-01-08 NOTE — Progress Notes (Signed)
Patient was not seen due to the fact that she just delivered. With normal BP and lacerations, patient can come back for regular PP visit in 4-5 weeks.

## 2021-01-15 ENCOUNTER — Telehealth (HOSPITAL_COMMUNITY): Payer: Self-pay | Admitting: *Deleted

## 2021-01-15 NOTE — Telephone Encounter (Signed)
Left message to return nurse call. Duffy Rhody, RN 01/15/2021 at 12:52p

## 2021-01-17 ENCOUNTER — Inpatient Hospital Stay (HOSPITAL_COMMUNITY): Admit: 2021-01-17 | Payer: Self-pay

## 2021-02-11 ENCOUNTER — Ambulatory Visit (INDEPENDENT_AMBULATORY_CARE_PROVIDER_SITE_OTHER): Payer: Medicaid Other | Admitting: Student

## 2021-02-11 VITALS — BP 132/79 | HR 74 | Wt 146.6 lb

## 2021-02-11 DIAGNOSIS — Z975 Presence of (intrauterine) contraceptive device: Secondary | ICD-10-CM

## 2021-02-11 DIAGNOSIS — Z30014 Encounter for initial prescription of intrauterine contraceptive device: Secondary | ICD-10-CM

## 2021-02-11 DIAGNOSIS — Z3202 Encounter for pregnancy test, result negative: Secondary | ICD-10-CM

## 2021-02-11 DIAGNOSIS — Z3402 Encounter for supervision of normal first pregnancy, second trimester: Secondary | ICD-10-CM

## 2021-02-11 LAB — POCT PREGNANCY, URINE: Preg Test, Ur: NEGATIVE

## 2021-02-11 MED ORDER — LEVONORGESTREL 20.1 MCG/DAY IU IUD
1.0000 | INTRAUTERINE_SYSTEM | Freq: Once | INTRAUTERINE | Status: AC
  Administered 2021-02-11: 1 via INTRAUTERINE

## 2021-02-11 NOTE — Progress Notes (Signed)
Post Partum Visit Note  Deanna Castillo is a 30 y.o. G71P3003 female who presents for a postpartum visit. She is 5 weeks postpartum following a normal spontaneous vaginal delivery.  I have fully reviewed the prenatal and intrapartum course. The delivery was at 38.2 gestational weeks.  Anesthesia: epidural. Postpartum course has been uneventful. Baby is doing well.  Baby is feeding by breast. Bleeding no bleeding. Bowel function is normal. Bladder function is normal. Patient is sexually active. Contraception method is none. Postpartum depression screening: negative.   The pregnancy intention screening data noted above was reviewed. Potential methods of contraception were discussed. The patient elected to proceed with No data recorded.   Edinburgh Postnatal Depression Scale - 02/11/21 1439       Edinburgh Postnatal Depression Scale:  In the Past 7 Days   I have been able to laugh and see the funny side of things. 0    I have looked forward with enjoyment to things. 0    I have blamed myself unnecessarily when things went wrong. 0    I have been anxious or worried for no good reason. 0    I have felt scared or panicky for no good reason. 0    Things have been getting on top of me. 0    I have been so unhappy that I have had difficulty sleeping. 0    I have felt sad or miserable. 0    I have been so unhappy that I have been crying. 0    The thought of harming myself has occurred to me. 0    Edinburgh Postnatal Depression Scale Total 0             Health Maintenance Due  Topic Date Due   COVID-19 Vaccine (1) Never done   INFLUENZA VACCINE  02/04/2021    The following portions of the patient's history were reviewed and updated as appropriate: allergies, current medications, past family history, past medical history, past social history, past surgical history, and problem list.  Review of Systems Pertinent items are noted in HPI.  Objective:  BP 132/79   Pulse 74   Wt 146 lb  9.6 oz (66.5 kg)   LMP 04/30/2020   Breastfeeding Yes   BMI 25.16 kg/m    General:  alert, cooperative, appears stated age, and no distress   Breasts:  Not done  Lungs: clear to auscultation bilaterally  Heart:  regular rate and rhythm, S1, S2 normal, no murmur, click, rub or gallop  Abdomen: soft, non-tender; bowel sounds normal; no masses,  no organomegaly   Wound Not indication  GU exam:   Normal       Assessment:    1. Encounter for supervision of low-risk first pregnancy in second trimester UPT neg Healthy  postpartum exam.   Plan:   Essential components of care per ACOG recommendations:  1.  Mood and well being: Patient with negative depression screening today. Reviewed local resources for support.  - Patient tobacco use? No.   - hx of drug use? No.    2. Infant care and feeding:  -Patient currently breastmilk feeding? Yes -Social determinants of health (SDOH) reviewed in EPIC. No concerns. The following needs were identified: None  3. Sexuality, contraception and birth spacing - Patient does not want a pregnancy in the next year.  Desired family size is unsure children.  - Reviewed forms of contraception in tiered fashion. Patient desired IUD today.   - Discussed birth spacing  of 18 months  4. Sleep and fatigue -Encouraged family/partner/community support of 4 hrs of uninterrupted sleep to help with mood and fatigue  5. Physical Recovery  - Discussed patients delivery and complications. She describes her labor as good. - Patient had a Vaginal, no problems at delivery. Patient had a no laceration. Perineal healing reviewed. Patient expressed understanding - Patient has urinary incontinence? No. - Patient is safe to resume physical and sexual activity  6.  Health Maintenance - HM due items addressed Yes - Last pap smear  Diagnosis  Date Value Ref Range Status  07/11/2020   Final   - Negative for intraepithelial lesion or malignancy (NILM)   Pap smear not done  at today's visit. Reviewed results and patient  does not need pap smear for three more years. -Breast Cancer screening indicated? No.   7. Chronic Disease/Pregnancy Condition follow up: None  - PCP follow up  Deanna Castillo, CNM Center for [facility], [facility]     GYNECOLOGY CLINIC PROCEDURE NOTE  Deanna Castillo is a 30 y.o. [phone] here for Liletta IUD insertion. No GYN concerns.  Last pap smear was on 07/2020 and was normal.  IUD Insertion Procedure Note Patient identified, informed consent performed, consent signed.   Discussed risks of irregular bleeding, cramping, infection, malpositioning or misplacement of the IUD outside the uterus which may require further procedure such as laparoscopy. Time out was performed.  Urine pregnancy test negative.  Speculum placed in the vagina.  Cervix visualized.  Cleaned with Betadine x 2.  Grasped anteriorly with a single tooth tenaculum.  Uterus sounded to 7 cm.  Liletta IUD placed per manufacturer's recommendations.  Strings trimmed to 3 cm. Tenaculum was removed, good hemostasis noted.  Patient tolerated procedure well.   Patient was given post-procedure instructions.  She was advised to have backup contraception for one week.  Patient was also asked to check IUD strings periodically and follow up in 4 weeks for IUD check.

## 2021-03-13 ENCOUNTER — Encounter: Payer: Self-pay | Admitting: Nurse Practitioner

## 2021-03-13 ENCOUNTER — Ambulatory Visit (INDEPENDENT_AMBULATORY_CARE_PROVIDER_SITE_OTHER): Payer: Medicaid Other | Admitting: Nurse Practitioner

## 2021-03-13 ENCOUNTER — Other Ambulatory Visit: Payer: Self-pay

## 2021-03-13 VITALS — BP 123/78 | HR 81 | Wt 156.1 lb

## 2021-03-13 DIAGNOSIS — Z30431 Encounter for routine checking of intrauterine contraceptive device: Secondary | ICD-10-CM | POA: Diagnosis not present

## 2021-03-13 DIAGNOSIS — R519 Headache, unspecified: Secondary | ICD-10-CM

## 2021-03-13 DIAGNOSIS — Z975 Presence of (intrauterine) contraceptive device: Secondary | ICD-10-CM | POA: Diagnosis not present

## 2021-03-13 NOTE — Progress Notes (Signed)
    GYNECOLOGY OFFICE ENCOUNTER NOTE  History:  30 y.o. [id number] here today for today for IUD string check; Liletta  IUD was placed  02-11-21. No complaints about the IUD, no concerning side effects.  Has had intercourse since IUD was placed and had no problems.  The following portions of the patient's history were reviewed and updated as appropriate: allergies, current medications, past family history, past medical history, past social history, past surgical history and problem list. Last pap smear on 07-11-20 was normal.  Review of Systems:  Pertinent items are noted in HPI.   Objective:  Physical Exam Blood pressure 123/78, pulse 81, weight 156 lb 1.6 oz (70.8 kg), last menstrual period 03/12/2021, currently breastfeeding. CONSTITUTIONAL: Well-developed, well-nourished female in no acute distress.  HENT:  Normocephalic, atraumatic. External right and left ear normal. Oropharynx is clear and moist EYES: Conjunctivae and EOM are normal. Pupils are equal, round, and reactive to light. No scleral icterus.  NECK: Normal range of motion, supple, no masses CARDIOVASCULAR: Normal heart rate noted RESPIRATORY: Effort and breath sounds normal, no problems with respiration noted ABDOMEN: Soft, no distention noted.   PELVIC: Normal appearing external genitalia; normal appearing vaginal mucosa and cervix.  IUD strings visualized, about 5 cm in length outside cervix. Strings trimmed to 3 cm - tolerated well.  No part of IUD visible in cervix.  On first menstrual cycle since IUD inserted.  Assessment & Plan:  Patient to keep IUD in place for up to seven years; can come in for removal if she desires pregnancy earlier or for or concerning side effects.   Reviewed staying hydrated and eating protein at every meal to avoid headaches.  Advised taking tylenol or ibuprofen for the headache.  Has gained 10 pounds.  Reviewed checking serving sizes and reducing soda intake to avoid further weight gain.  Nolene Bernheim, RN, MSN, NP-BC Nurse Practitioner, [facility] for [employer], [facility] 03/13/2021 11:59 AM

## 2021-03-13 NOTE — Progress Notes (Signed)
Patient is here for IUD string check. [name] complains of headaches of right side. Patient stated that the headache "is right behind the eye". She also has experienced pelvic cramps

## 2023-10-07 ENCOUNTER — Ambulatory Visit (HOSPITAL_COMMUNITY)
Admission: EM | Admit: 2023-10-07 | Discharge: 2023-10-07 | Disposition: A | Discharge status: Home / Self Care | Payer: Self-pay

## 2023-10-07 ENCOUNTER — Other Ambulatory Visit: Payer: Self-pay

## 2023-10-07 ENCOUNTER — Encounter (HOSPITAL_COMMUNITY): Payer: Self-pay | Admitting: *Deleted

## 2023-10-07 DIAGNOSIS — K529 Noninfective gastroenteritis and colitis, unspecified: Secondary | ICD-10-CM

## 2023-10-07 MED ORDER — LOPERAMIDE HCL 2 MG PO CAPS: 2.0000 mg | ORAL_CAPSULE | Freq: Four times a day (QID) | ORAL | 0 refills | Status: AC | PRN

## 2023-10-07 MED ORDER — ONDANSETRON 4 MG PO TBDP: 4.0000 mg | ORAL_TABLET | Freq: Three times a day (TID) | ORAL | 0 refills | Status: AC | PRN

## 2023-10-07 NOTE — ED Triage Notes (Signed)
 PT reports vomiting and diarrhea started on Tuesday night. Pt continues to have diarrhea.

## 2023-10-07 NOTE — ED Provider Notes (Signed)
 [facility]  Note:  This document was prepared using Dragon voice recognition software and may include unintentional dictation errors.  MRN: [id number] DOB: September 20, 1990  Subjective:   Deanna Castillo is a 33 y.o. female presenting for nausea/vomiting and diarrhea x 2 days.  Patient reports that her children all had similar symptoms at different times last week.  Patient states that her youngest 33-year-old daughter got symptoms a couple days ago and after taking care of her she is now developed symptoms.  Patient has not taken any over-the-counter medication to treat symptoms.  Patient states that nausea and vomiting has subsided for the most part but she is still having diarrhea.  Patient states she has not eaten anything today which is why she thinks that the nausea and vomiting has improved.  No current facility-administered medications for this encounter.  Current Outpatient Medications:    loperamide (IMODIUM) 2 MG capsule, Take 1 capsule (2 mg total) by mouth 4 (four) times daily as needed for diarrhea or loose stools., Disp: 16 capsule, Rfl: 0   ondansetron (ZOFRAN-ODT) 4 MG disintegrating tablet, Take 1 tablet (4 mg total) by mouth every 8 (eight) hours as needed for nausea or vomiting., Disp: 30 tablet, Rfl: 0   Allergies  Allergen Reactions   Other Anaphylaxis    Cat dander-- "respiratory distress"    Past Medical History:  Diagnosis Date   Asthma    Normal labor 08/04/2016   Poor weight gain of pregnancy, third trimester 07/31/2016   Lost weight initially, but gaining well third trimester. Watch fundal height. Growth [place] PRN.      Past Surgical History:  Procedure Laterality Date   NO PAST SURGERIES      Family History  Problem Relation Age of Onset   Lung cancer Maternal Grandmother    Hypertension Mother    Diabetes Maternal Aunt     Social History   Tobacco Use   Smoking status: Never   Smokeless tobacco: Never  Vaping Use   Vaping status: Never  Used  Substance Use Topics   Alcohol use: Not Currently    Comment: Rarely   Drug use: Not Currently    Types: Marijuana    ROS Refer to HPI for ROS details.  Objective:   Vitals: BP 118/80   Pulse 72   Temp 99.2 F (37.3 C)   Resp 18   LMP 10/07/2023   SpO2 93%   Physical Exam Vitals and nursing note reviewed.  Constitutional:      General: She is not in acute distress.    Appearance: Normal appearance. She is well-developed. She is not ill-appearing or toxic-appearing.  HENT:     Head: Normocephalic.  Cardiovascular:     Rate and Rhythm: Normal rate.  Pulmonary:     Effort: Pulmonary effort is normal. No respiratory distress.  Abdominal:     General: There is no distension.     Palpations: Abdomen is soft.     Tenderness: There is no abdominal tenderness (No tenderness with palpation but cramping with diarrhea.). There is no guarding or rebound.  Skin:    General: Skin is warm and dry.  Neurological:     General: No focal deficit present.     Mental Status: She is alert and oriented to person, place, and time.  Psychiatric:        Mood and Affect: Mood normal.     Procedures  No results found for this or any previous visit (from the past 24  hours).  Assessment and Plan :   PDMP not reviewed this encounter.  1. Gastroenteritis    1. Gastroenteritis (Primary) - ondansetron (ZOFRAN-ODT) 4 MG disintegrating tablet; Take 1 tablet (4 mg total) by mouth every 8 (eight) hours as needed for nausea or vomiting.  Dispense: 30 tablet; Refill: 0 - loperamide (IMODIUM) 2 MG capsule; Take 1 capsule (2 mg total) by mouth 4 (four) times daily as needed for diarrhea or loose stools.  Dispense: 16 capsule; Refill: 0 -Continue to monitor symptoms for any change in severity if there is any escalation of current symptoms or development of new symptoms follow-up in ER for further evaluation and management.  Lucky Cowboy   [place], [place], [place] 10/07/23 [phone]

## 2023-10-07 NOTE — Discharge Instructions (Signed)
 1. Gastroenteritis (Primary) - ondansetron (ZOFRAN-ODT) 4 MG disintegrating tablet; Take 1 tablet (4 mg total) by mouth every 8 (eight) hours as needed for nausea or vomiting.  Dispense: 30 tablet; Refill: 0 - loperamide (IMODIUM) 2 MG capsule; Take 1 capsule (2 mg total) by mouth 4 (four) times daily as needed for diarrhea or loose stools.  Dispense: 16 capsule; Refill: 0

## 2024-03-08 ENCOUNTER — Ambulatory Visit: Payer: Self-pay | Admitting: Obstetrics and Gynecology
